# Patient Record
Sex: Female | Born: 1945 | Race: White | Hispanic: No | Marital: Married | Smoking: Never smoker
Health system: Southern US, Community
[De-identification: ages and names within clinical notes are randomized; demographics above are authoritative.]

## PROBLEM LIST (undated history)

## (undated) DIAGNOSIS — Z8619 Personal history of other infectious and parasitic diseases: Secondary | ICD-10-CM

## (undated) HISTORY — PX: BREAST BIOPSY: SHX20

## (undated) HISTORY — DX: Personal history of other infectious and parasitic diseases: Z86.19

---

## 1988-03-08 HISTORY — PX: ABDOMINAL HYSTERECTOMY: SHX81

## 2001-09-13 LAB — HM PAP SMEAR: HM PAP: NORMAL

## 2002-10-07 HISTORY — PX: OTHER SURGICAL HISTORY: SHX169

## 2006-02-03 ENCOUNTER — Ambulatory Visit: Payer: Self-pay | Admitting: Gastroenterology

## 2006-02-03 DIAGNOSIS — Z86010 Personal history of colonic polyps: Secondary | ICD-10-CM | POA: Insufficient documentation

## 2006-02-03 DIAGNOSIS — Z860101 Personal history of adenomatous and serrated colon polyps: Secondary | ICD-10-CM | POA: Insufficient documentation

## 2006-03-08 HISTORY — PX: BREAST SURGERY: SHX581

## 2007-09-06 ENCOUNTER — Ambulatory Visit: Payer: Self-pay | Admitting: Family Medicine

## 2007-09-13 LAB — HM DEXA SCAN: HM Dexa Scan: NORMAL

## 2009-09-26 ENCOUNTER — Ambulatory Visit: Payer: Self-pay | Admitting: Gastroenterology

## 2009-09-26 LAB — HM COLONOSCOPY

## 2012-06-21 LAB — LIPID PANEL
Cholesterol: 228 mg/dL — AB (ref 0–200)
HDL: 74 mg/dL — AB (ref 35–70)
LDL CALC: 131 mg/dL
Triglycerides: 113 mg/dL (ref 40–160)

## 2012-06-21 LAB — BASIC METABOLIC PANEL: Glucose: 88 mg/dL

## 2012-06-28 LAB — HM MAMMOGRAPHY

## 2014-11-20 ENCOUNTER — Telehealth: Payer: Self-pay | Admitting: Family Medicine

## 2014-11-20 NOTE — Telephone Encounter (Signed)
Advised pt, okayed per Dr. Caryn Section, pt can get  both the flu and pneumonia vaccinations at the same time. Scheduled her an appointment on 11/23/2014 for her and an appointment for her husband Joneen Boers for a flu shot on 11/23/2014.  Thanks,

## 2014-11-20 NOTE — Telephone Encounter (Signed)
Pt is asking if she should get the pneumonia shot or the flu shot first. Pt stated she wasn't sure if she can get them together and she doesn't want to over load her body by getting them at the same time. Pt request a nurse to return her call. Thanks TNP

## 2014-11-23 ENCOUNTER — Ambulatory Visit (INDEPENDENT_AMBULATORY_CARE_PROVIDER_SITE_OTHER): Payer: Medicare Other

## 2014-11-23 DIAGNOSIS — Z23 Encounter for immunization: Secondary | ICD-10-CM | POA: Diagnosis not present

## 2014-11-23 NOTE — Addendum Note (Signed)
Addended by: Silvio Clayman on: 11/23/2014 11:25 AM   Modules accepted: Orders

## 2014-12-11 DIAGNOSIS — S8000XA Contusion of unspecified knee, initial encounter: Secondary | ICD-10-CM | POA: Insufficient documentation

## 2014-12-11 DIAGNOSIS — K5790 Diverticulosis of intestine, part unspecified, without perforation or abscess without bleeding: Secondary | ICD-10-CM | POA: Insufficient documentation

## 2014-12-12 ENCOUNTER — Encounter: Payer: Self-pay | Admitting: Family Medicine

## 2014-12-12 ENCOUNTER — Ambulatory Visit (INDEPENDENT_AMBULATORY_CARE_PROVIDER_SITE_OTHER): Payer: Medicare Other | Admitting: Family Medicine

## 2014-12-12 VITALS — BP 94/70 | HR 56 | Temp 97.9°F | Resp 16 | Ht 62.0 in | Wt 121.0 lb

## 2014-12-12 DIAGNOSIS — Z1159 Encounter for screening for other viral diseases: Secondary | ICD-10-CM | POA: Diagnosis not present

## 2014-12-12 DIAGNOSIS — R001 Bradycardia, unspecified: Secondary | ICD-10-CM | POA: Diagnosis not present

## 2014-12-12 DIAGNOSIS — Z0000 Encounter for general adult medical examination without abnormal findings: Secondary | ICD-10-CM | POA: Diagnosis not present

## 2014-12-12 DIAGNOSIS — Z1239 Encounter for other screening for malignant neoplasm of breast: Secondary | ICD-10-CM | POA: Diagnosis not present

## 2014-12-12 DIAGNOSIS — Z131 Encounter for screening for diabetes mellitus: Secondary | ICD-10-CM

## 2014-12-12 DIAGNOSIS — E2839 Other primary ovarian failure: Secondary | ICD-10-CM

## 2014-12-12 NOTE — Progress Notes (Signed)
Patient: Kristin Small, Female    DOB: 09/27/1945, 69 y.o.   MRN: [id number] Visit Date: 12/12/2014  Today's Provider: Lelon Huh, MD   Chief Complaint  Patient presents with  . Annual Exam   Subjective:    Annual physical  Kristin Small is a 69 y.o. female. She feels fairly well. She reports exercising  3 days a week. She reports she is sleeping poorly. Trouble staying asleep.   -----------------------------------------------------------   Review of Systems  Constitutional: Negative for fever, chills and fatigue.  HENT: Positive for hearing loss, postnasal drip and sinus pressure. Negative for congestion, ear pain, rhinorrhea, sneezing and sore throat.   Eyes: Negative.  Negative for pain and redness.  Respiratory: Negative for cough, shortness of breath and wheezing.   Cardiovascular: Negative for chest pain and leg swelling.  Gastrointestinal: Negative for nausea, abdominal pain, diarrhea, constipation and blood in stool.  Endocrine: Negative for polydipsia and polyphagia.  Genitourinary: Negative.  Negative for dysuria, hematuria, flank pain, vaginal bleeding, vaginal discharge and pelvic pain.  Musculoskeletal: Negative for back pain, joint swelling, arthralgias and gait problem.  Skin: Negative for rash.  Allergic/Immunologic: Positive for food allergies.  Neurological: Negative.  Negative for dizziness, tremors, seizures, weakness, light-headedness, numbness and headaches.  Hematological: Negative for adenopathy.  Psychiatric/Behavioral: Positive for sleep disturbance. Negative for behavioral problems, confusion and dysphoric mood. The patient is nervous/anxious. The patient is not hyperactive.     Social History   Social History  . Marital Status: Married    Spouse Name: N/A  . Number of Children: 2  . Years of Education: N/A   Occupational History  . Retired     Former Control and instrumentation engineer   Social History Main Topics  . Smoking status: Never Smoker     . Smokeless tobacco: Not on file  . Alcohol Use: 0.0 oz/week    0 Standard drinks or equivalent per week     Comment: occasional alcohol use; glass of wine with meal  . Drug Use: No  . Sexual Activity: Not on file   Other Topics Concern  . Not on file   Social History Narrative    Patient Active Problem List   Diagnosis Date Noted  . Diverticulosis 12/11/2014    Past Surgical History  Procedure Laterality Date  . Breast surgery Left 2008    multiple breast cyst  . Abdominal hysterectomy  1990    for heavy bleeding  . Cesarean section    . Colon polyps  01/2006  . Tubular adenoma of rectum  10/2002    Her family history includes Alzheimer's disease in her other and other; Atrial fibrillation in her mother; Dementia in her mother; Esophageal cancer in her father; Heart attack in her maternal grandfather; Lung disease in her father; Multiple myeloma in her sister; Pancreatitis in her father; Stroke in her mother; Thyroid disease in her mother and sister.    Previous Medications   ASPIRIN 81 MG TABLET    Take 1 tablet by mouth daily.   B COMPLEX VITAMINS CAPSULE    Take 1 capsule by mouth every other day.    CALCIUM-MAGNESIUM-VITAMIN D (CALCIUM 500 PO)    Take 1 tablet by mouth daily.   CHOLECALCIFEROL (VITAMIN D HIGH POTENCY) 1000 UNITS CAPSULE    Take 1 capsule by mouth daily.   FERROUS SULFATE 325 (65 FE) MG TABLET    Take 325 mg by mouth once a week.  Patient Care Team: Birdie Sons, MD as PCP - General (Family Medicine)     Objective:   Filed Vitals:   12/12/14 0953 12/12/14 1055  BP: 94/70   Pulse: 70 56  Temp: 97.9 F (36.6 C)   TempSrc: Oral   Resp: 16   Height: _0  (1.575 m)   Weight: 121 lb (54.885 kg)   SpO2: 98%     Physical Exam  General Appearance:    Alert, cooperative, no distress, appears stated age  Head:    Normocephalic, without obvious abnormality, atraumatic  Eyes:    PERRL, conjunctiva/corneas clear, EOM's intact, fundi     benign, both eyes  Ears:    Normal TM's and external ear canals, both ears  Nose:   Nares normal, septum midline, mucosa normal, no drainage    or sinus tenderness  Throat:   Lips, mucosa, and tongue normal; teeth and gums normal  Neck:   Supple, symmetrical, trachea midline, no adenopathy;    thyroid:  no enlargement/tenderness/nodules; no carotid   bruit or JVD  Back:     Symmetric, no curvature, ROM normal, no CVA tenderness  Lungs:     Clear to auscultation bilaterally, respirations unlabored  Chest Wall:    No tenderness or deformity   Heart:    Bradycardic. Regular rhythm, S1 and S2 normal, no murmur, rub   or gallop  Breast Exam:    Normal appearance, non tender, no masses  Abdomen:     Soft, non-tender, bowel sounds active all four quadrants,    no masses, no organomegaly  Pelvic:    deferred and not indicated; status post hysterectomy, negative ROS  Extremities:   Extremities normal, atraumatic, no cyanosis or edema  Pulses:   2+ and symmetric all extremities  Skin:   Skin color, texture, turgor normal, no rashes or lesions  Lymph nodes:   Cervical, supraclavicular, and axillary nodes normal  Neurologic:   CNII-XII intact, normal strength, sensation and reflexes    throughout   Activities of Daily Living In your present state of health, do you have any difficulty performing the following activities: 12/12/2014  Hearing? Y  Vision? N  Difficulty concentrating or making decisions? Y  Walking or climbing stairs? N  Dressing or bathing? N  Doing errands, shopping? N    Fall Risk Assessment Fall Risk  12/12/2014  Falls in the past year? No     Depression Screen PHQ 2/9 Scores 12/12/2014 12/12/2014  PHQ - 2 Score 2 2  PHQ- 9 Score 4 -    Cognitive Testing - 6-CIT  Correct? Score   What year is it? yes 0 0 or 4  What month is it? yes 0 0 or 3  Memorize:    Pia Mau,  43,  [place],      What time is it? (within 1 hour) yes 0 0 or 3  Count backwards from  20 yes 0 0, 2, or 4  Name the months of the year yes 0 0, 2, or 4  Repeat name & address above yes 0 0, 2, 4, 6, 8, or 10       TOTAL SCORE  0/28   Interpretation:  Normal  Normal (0-7) Abnormal (8-28)     Audit-C Alcohol Use Screening  Question Answer Points  How often do you have alcoholic drink? 2-3 times weekly 3  On days you do drink alcohol, how many drinks do you typically consume? 0 0  How oftey will you drink 6 or more in a total? never 0  Total Score:  3   A score of 3 or more in women, and 4 or more in men indicates increased risk for alcohol abuse, EXCEPT if all of the points are from question 1.   EKG: SR. 1 PAC  Assessment & Plan:     CPE  Reviewed patient's Family Medical History Reviewed and updated list of patient's medical providers Assessment of cognitive impairment was done Assessed patient's functional ability Established a written schedule for health screening [place] Completed and Reviewed  Exercise Activities and Dietary recommendations Goals    None      Immunization History  Administered Date(s) Administered  . Influenza, High Dose Seasonal PF 11/23/2014  . Pneumococcal Conjugate-13 11/23/2014  . Tdap 09/06/2007, 12/24/2011  . Zoster 06/20/2012    Health Maintenance  Topic Date Due  . Hepatitis C Screening  Aug 15, 1945  . MAMMOGRAM  06/29/2014  . COLONOSCOPY  09/27/2014  . INFLUENZA VACCINE  10/07/2015  . PNA vac Low Risk Adult (2 of 2 - PPSV23) 11/23/2015  . TETANUS/TDAP  12/23/2021  . DEXA SCAN  Completed  . ZOSTAVAX  Completed      Discussed health benefits of physical activity, and encouraged her to engage in regular exercise appropriate for her age and condition.    ------------------------------------------------------------------------------------------------------------ 1. Annual physical exam Generally doing well with normal exam aside from mild bradycardia She states she has received letter from  [facility] to scheduled colonoscopy. She anticipates calling soon.  - Lipid panel - Basic metabolic panel  2. Screening for breast cancer  - MM DIGITAL SCREENING BILATERAL  3. Estrogen deficiency  - DG Bone Density; Future  4. Bradycardia  - EKG 12-Lead  5. Need for hepatitis C screening test  - Hepatitis C antibody

## 2014-12-13 ENCOUNTER — Encounter: Payer: Self-pay | Admitting: Family Medicine

## 2014-12-13 ENCOUNTER — Telehealth: Payer: Self-pay | Admitting: *Deleted

## 2014-12-13 DIAGNOSIS — Z90710 Acquired absence of both cervix and uterus: Secondary | ICD-10-CM | POA: Insufficient documentation

## 2014-12-13 LAB — BASIC METABOLIC PANEL WITH GFR
BUN/Creatinine Ratio: 9 — ABNORMAL LOW (ref 11–26)
BUN: 8 mg/dL (ref 8–27)
CO2: 26 mmol/L (ref 18–29)
Calcium: 9.9 mg/dL (ref 8.7–10.3)
Chloride: 100 mmol/L (ref 97–108)
Creatinine, Ser: 0.89 mg/dL (ref 0.57–1.00)
GFR calc Af Amer: 77 mL/min/1.73
GFR calc non Af Amer: 67 mL/min/1.73
Glucose: 86 mg/dL (ref 65–99)
Potassium: 4.1 mmol/L (ref 3.5–5.2)
Sodium: 143 mmol/L (ref 134–144)

## 2014-12-13 LAB — LIPID PANEL
Chol/HDL Ratio: 2.7 ratio units (ref 0.0–4.4)
Cholesterol, Total: 268 mg/dL — ABNORMAL HIGH (ref 100–199)
HDL: 98 mg/dL (ref >39)
LDL Calculated: 155 mg/dL — ABNORMAL HIGH (ref 0–99)
Triglycerides: 76 mg/dL (ref 0–149)
VLDL Cholesterol Cal: 15 mg/dL (ref 5–40)

## 2014-12-13 LAB — HEPATITIS C ANTIBODY: Hep C Virus Ab: <0.1 {s_co_ratio} (ref 0.0–0.9)

## 2014-12-13 NOTE — Telephone Encounter (Signed)
Pt is requesting a call back to discuss lab results.  CB#[phone]

## 2014-12-13 NOTE — Telephone Encounter (Signed)
Returned pt's call. Patient wanted her cholesterol numbers.

## 2014-12-27 ENCOUNTER — Telehealth: Payer: Self-pay | Admitting: *Deleted

## 2014-12-27 NOTE — Telephone Encounter (Signed)
-----   Message from Birdie Sons, MD sent at 12/27/2014  7:59 AM EDT ----- Regarding: BMD Results Please advise patient that bone density test is normal. Repeat in 3 years.

## 2014-12-31 NOTE — Telephone Encounter (Signed)
Pt advised-aa 

## 2015-01-07 ENCOUNTER — Encounter: Payer: Self-pay | Admitting: Family Medicine

## 2015-01-14 ENCOUNTER — Encounter: Payer: Self-pay | Admitting: Family Medicine

## 2015-06-10 ENCOUNTER — Encounter: Payer: Self-pay | Admitting: Family Medicine

## 2015-12-05 ENCOUNTER — Ambulatory Visit (INDEPENDENT_AMBULATORY_CARE_PROVIDER_SITE_OTHER): Payer: Medicare Other

## 2015-12-05 DIAGNOSIS — Z23 Encounter for immunization: Secondary | ICD-10-CM | POA: Diagnosis not present

## 2015-12-05 NOTE — Progress Notes (Signed)
Vaccines today only.

## 2016-01-22 ENCOUNTER — Ambulatory Visit (INDEPENDENT_AMBULATORY_CARE_PROVIDER_SITE_OTHER): Payer: Medicare Other | Admitting: Family Medicine

## 2016-01-22 ENCOUNTER — Encounter: Payer: Self-pay | Admitting: Family Medicine

## 2016-01-22 VITALS — BP 120/84 | HR 84 | Temp 97.6°F | Resp 16 | Wt 116.0 lb

## 2016-01-22 DIAGNOSIS — J069 Acute upper respiratory infection, unspecified: Secondary | ICD-10-CM

## 2016-01-22 DIAGNOSIS — B9789 Other viral agents as the cause of diseases classified elsewhere: Secondary | ICD-10-CM

## 2016-01-22 NOTE — Progress Notes (Signed)
Subjective:     Patient ID: Kristin Small, female   DOB: March 19, 1945, 70 y.o.   MRN: [id number]  HPI  Chief Complaint  Patient presents with  . URI    x 4 days. Mostly dry cough, sometimes is productive of clear sputum. Chest tightness, PND, rhinorrhea, H/A. Denies fever, facial pain/pressure. Pt has tried Mucinex. Pt is cocnerned because she is visiting her pregnant daughter and 2 grandchildren in [place], [place] for Thanksgiving.  Her husband was ill first and seen here a few days ago for URI.   Review of Systems     Objective:   Physical Exam  Constitutional: She appears well-developed and well-nourished. No distress.  Ears: T.M's intact without inflammation Throat: no tonsillar enlargement or exudate Neck: no cervical adenopathy Lungs: clear     Assessment:    1. Viral upper respiratory tract infection    Plan:    Discussed use of Mucinex D for congestion, Delsym for cough, and Benadryl for postnasal drainage   To call 11/20 if sinuses not improving.

## 2016-01-22 NOTE — Patient Instructions (Signed)
Discussed use of Mucinex D for congestion, Delsym for cough, and Benadryl for postnasal drainage. Let me know on Monday, 11/20, if sinuses not improving.

## 2016-04-16 ENCOUNTER — Telehealth: Payer: Self-pay | Admitting: Family Medicine

## 2016-04-16 NOTE — Telephone Encounter (Signed)
Called Pt to schedule AWV with [facility] - knb °

## 2016-05-14 ENCOUNTER — Encounter: Payer: Self-pay | Admitting: Family Medicine

## 2016-05-14 ENCOUNTER — Ambulatory Visit (INDEPENDENT_AMBULATORY_CARE_PROVIDER_SITE_OTHER): Payer: Medicare Other | Admitting: Family Medicine

## 2016-05-14 VITALS — BP 122/84 | HR 78 | Temp 97.6°F | Resp 16 | Wt 114.6 lb

## 2016-05-14 DIAGNOSIS — J301 Allergic rhinitis due to pollen: Secondary | ICD-10-CM | POA: Diagnosis not present

## 2016-05-14 DIAGNOSIS — H109 Unspecified conjunctivitis: Secondary | ICD-10-CM

## 2016-05-14 MED ORDER — FLUTICASONE PROPIONATE 50 MCG/ACT NA SUSP: 2.0000 | Freq: Every day | NASAL | 6 refills | Status: AC

## 2016-05-14 MED ORDER — SULFACETAMIDE SODIUM 10 % OP SOLN: 2.0000 [drp] | Freq: Four times a day (QID) | OPHTHALMIC | 0 refills | Status: DC

## 2016-05-14 NOTE — Progress Notes (Signed)
Subjective:     Patient ID: Kristin Small, female   DOB: 12/29/1945, 71 y.o.   MRN: [id number]  HPI  Chief Complaint  Patient presents with  . Sinus Problem    Patient comes into office today with concerns of sinus pain and pressure for the past 1.5-2 weeks. Patient states that she has had red eyes, discharge from eyes in AM and watery eyes. Patient reports she believes it was allergies because she has been out of town in [place]. Patient also reports sinus pain around forehead and post nasal drip. Patient states that she has taken otc Mucinex D and Tylenol.   States she developed sore throat on or about 2/25 while in [place] helping to take care of her grandchildren. Subsequently has developed watery eyes and sinus congestion without cough. Left eye has been red but not itchy.   Review of Systems     Objective:   Physical Exam  Constitutional: She appears well-developed and well-nourished. No distress.  Ears: R T.M intact without inflammation; L TM obscured by cerumen Eyes: Right eye unremarkable, left eye with mild conjunctival erythema but sclera is clear; no drainage noted. Sinuses: non-tender Throat: no tonsillar enlargement or exudate Neck: no cervical adenopathy Lungs: clear     Assessment:    1. Acute seasonal allergic rhinitis due to polle - fluticasone (FLONASE) 50 MCG/ACT nasal spray; Place 2 sprays into both nostrils daily.  Dispense: 16 g; Refill: 6  2. Conjunctivitis of left eye, unspecified conjunctivitis type - sulfacetamide (BLEPH-10) 10 % ophthalmic solution; Place 2 drops into both eyes 4 (four) times daily.  Dispense: 15 mL; Refill: 0    Plan:    Discussed use of frequent warm eye compresses. If matting tomorrow to start eye abx. Add Claritin or similar. Discussed technique with steroid nasal spray. She is to call for purulent sinus drainage.

## 2016-05-14 NOTE — Patient Instructions (Signed)
Discussed of warm eye compresses and starting allergy medication-Claritin or similar and steroid nasal spray. If your eye matting does not improve start the eye antibiotic.

## 2016-08-26 ENCOUNTER — Ambulatory Visit (INDEPENDENT_AMBULATORY_CARE_PROVIDER_SITE_OTHER): Payer: Medicare Other

## 2016-08-26 VITALS — BP 130/76 | HR 60 | Temp 98.3°F | Ht 62.0 in | Wt 112.2 lb

## 2016-08-26 DIAGNOSIS — Z0000 Encounter for general adult medical examination without abnormal findings: Secondary | ICD-10-CM

## 2016-08-26 NOTE — Patient Instructions (Addendum)
Kristin Small , Thank you for taking time to come for your Medicare Wellness Visit. I appreciate your ongoing commitment to your health goals. Please review the following plan we discussed and let me know if I can assist you in the future.   Screening recommendations/referrals: Colonoscopy: declined referral, would like to speak to doctor about this further Mammogram: completed 12/27/14, due now Bone Density: completed 12/17/14 Recommended yearly ophthalmology/optometry visit for glaucoma screening and checkup Recommended yearly dental visit for hygiene and checkup  Vaccinations: Influenza vaccine: due 11/2016 Pneumococcal vaccine: completed series Tdap vaccine: completed 12/24/11 Shingles vaccine: completed 06/20/12  Advanced directives: Advance directive discussed with you today. I have provided a copy for you to complete at home and have notarized. Once this is complete please bring a copy in to our office so we can scan it into your chart.  Conditions/risks identified: Recommend decreasing wine intake to 0-1 glasses a day.   Next appointment: None, need to schedule follow up with PCP and 1 year AWV.   Preventive Care 44 Years and Older, Female Preventive care refers to lifestyle choices and visits with your health care provider that can promote health and wellness. What does preventive care include?  A yearly physical exam. This is also called an annual well check.  Dental exams once or twice a year.  Routine eye exams. Ask your health care provider how often you should have your eyes checked.  Personal lifestyle choices, including:  Daily care of your teeth and gums.  Regular physical activity.  Eating a healthy diet.  Avoiding tobacco and drug use.  Limiting alcohol use.  Practicing safe sex.  Taking low-dose aspirin every day.  Taking vitamin and mineral supplements as recommended by your health care provider. What happens during an annual well check? The  services and screenings done by your health care provider during your annual well check will depend on your age, overall health, lifestyle risk factors, and family history of disease. Counseling  Your health care provider may ask you questions about your:  Alcohol use.  Tobacco use.  Drug use.  Emotional well-being.  Home and relationship well-being.  Sexual activity.  Eating habits.  History of falls.  Memory and ability to understand (cognition).  Work and work Statistician.  Reproductive health. Screening  You may have the following tests or measurements:  Height, weight, and BMI.  Blood pressure.  Lipid and cholesterol levels. These may be checked every 5 years, or more frequently if you are over 45 years old.  Skin check.  Lung cancer screening. You may have this screening every year starting at age 89 if you have a 30-pack-year history of smoking and currently smoke or have quit within the past 15 years.  Fecal occult blood test (FOBT) of the stool. You may have this test every year starting at age 10.  Flexible sigmoidoscopy or colonoscopy. You may have a sigmoidoscopy every 5 years or a colonoscopy every 10 years starting at age 70.  Hepatitis C blood test.  Hepatitis B blood test.  Sexually transmitted disease (STD) testing.  Diabetes screening. This is done by checking your blood sugar (glucose) after you have not eaten for a while (fasting). You may have this done every 1-3 years.  Bone density scan. This is done to screen for osteoporosis. You may have this done starting at age 35.  Mammogram. This may be done every 1-2 years. Talk to your health care provider about how often you should have regular mammograms.  Talk with your health care provider about your test results, treatment options, and if necessary, the need for more tests. Vaccines  Your health care provider may recommend certain vaccines, such as:  Influenza vaccine. This is recommended  every year.  Tetanus, diphtheria, and acellular pertussis (Tdap, Td) vaccine. You may need a Td booster every 10 years.  Zoster vaccine. You may need this after age 30.  Pneumococcal 13-valent conjugate (PCV13) vaccine. One dose is recommended after age 15.  Pneumococcal polysaccharide (PPSV23) vaccine. One dose is recommended after age 55. Talk to your health care provider about which screenings and vaccines you need and how often you need them. This information is not intended to replace advice given to you by your health care provider. Make sure you discuss any questions you have with your health care provider. Document Released: 03/21/2015 Document Revised: 11/12/2015 Document Reviewed: 12/24/2014 Elsevier Interactive Patient Education  2017 [place] Prevention in the Home Falls can cause injuries. They can happen to people of all ages. There are many things you can do to make your home safe and to help prevent falls. What can I do on the outside of my home?  Regularly fix the edges of walkways and driveways and fix any cracks.  Remove anything that might make you trip as you walk through a door, such as a raised step or threshold.  Trim any bushes or trees on the path to your home.  Use bright outdoor lighting.  Clear any walking paths of anything that might make someone trip, such as rocks or tools.  Regularly check to see if handrails are loose or broken. Make sure that both sides of any steps have handrails.  Any raised decks and porches should have guardrails on the edges.  Have any leaves, snow, or ice cleared regularly.  Use sand or salt on walking paths during winter.  Clean up any spills in your garage right away. This includes oil or grease spills. What can I do in the bathroom?  Use night lights.  Install grab bars by the toilet and in the tub and shower. Do not use towel bars as grab bars.  Use non-skid mats or decals in the tub or shower.  If  you need to sit down in the shower, use a plastic, non-slip stool.  Keep the floor dry. Clean up any water that spills on the floor as soon as it happens.  Remove soap buildup in the tub or shower regularly.  Attach bath mats securely with double-sided non-slip rug tape.  Do not have throw rugs and other things on the floor that can make you trip. What can I do in the bedroom?  Use night lights.  Make sure that you have a light by your bed that is easy to reach.  Do not use any sheets or blankets that are too big for your bed. They should not hang down onto the floor.  Have a firm chair that has side arms. You can use this for support while you get dressed.  Do not have throw rugs and other things on the floor that can make you trip. What can I do in the kitchen?  Clean up any spills right away.  Avoid walking on wet floors.  Keep items that you use a lot in easy-to-reach places.  If you need to reach something above you, use a strong step stool that has a grab bar.  Keep electrical cords out of the way.  Do not use floor polish or wax that makes floors slippery. If you must use wax, use non-skid floor wax.  Do not have throw rugs and other things on the floor that can make you trip. What can I do with my stairs?  Do not leave any items on the stairs.  Make sure that there are handrails on both sides of the stairs and use them. Fix handrails that are broken or loose. Make sure that handrails are as long as the stairways.  Check any carpeting to make sure that it is firmly attached to the stairs. Fix any carpet that is loose or worn.  Avoid having throw rugs at the top or bottom of the stairs. If you do have throw rugs, attach them to the floor with carpet tape.  Make sure that you have a light switch at the top of the stairs and the bottom of the stairs. If you do not have them, ask someone to add them for you. What else can I do to help prevent falls?  Wear shoes  that:  Do not have high heels.  Have rubber bottoms.  Are comfortable and fit you well.  Are closed at the toe. Do not wear sandals.  If you use a stepladder:  Make sure that it is fully opened. Do not climb a closed stepladder.  Make sure that both sides of the stepladder are locked into place.  Ask someone to hold it for you, if possible.  Clearly mark and make sure that you can see:  Any grab bars or handrails.  First and last steps.  Where the edge of each step is.  Use tools that help you move around (mobility aids) if they are needed. These include:  Canes.  Walkers.  Scooters.  Crutches.  Turn on the lights when you go into a dark area. Replace any light bulbs as soon as they burn out.  Set up your furniture so you have a clear path. Avoid moving your furniture around.  If any of your floors are uneven, fix them.  If there are any pets around you, be aware of where they are.  Review your medicines with your doctor. Some medicines can make you feel dizzy. This can increase your chance of falling. Ask your doctor what other things that you can do to help prevent falls. This information is not intended to replace advice given to you by your health care provider. Make sure you discuss any questions you have with your health care provider. Document Released: 12/19/2008 Document Revised: 07/31/2015 Document Reviewed: 03/29/2014 Elsevier Interactive Patient Education  2017 [employer].

## 2016-08-26 NOTE — Progress Notes (Signed)
Subjective:   Kristin Small is a 71 y.o. female who presents for Medicare Annual (Subsequent) preventive examination.  Review of Systems:  N/A  Cardiac Risk Factors include: advanced age (>44mn, >>72women);sedentary lifestyle     Objective:     Vitals: BP 130/76 (BP Location: Right Arm)   Pulse 60   Temp 98.3 F (36.8 C) (Oral)   Ht 5' 2"  (1.575 m)   Wt 112 lb 3.2 oz (50.9 kg)   BMI 20.52 kg/m   Body mass index is 20.52 kg/m.   Tobacco History  Smoking Status  . Never Smoker  Smokeless Tobacco  . Never Used     Counseling given: Not Answered   Past Medical History:  Diagnosis Date  . Diverticulosis   . History of chicken pox    Past Surgical History:  Procedure Laterality Date  . ABDOMINAL HYSTERECTOMY  1990   for heavy bleeding  . BREAST SURGERY Left 2008   multiple breast cyst  . CESAREAN SECTION    . Tubular Adenoma of rectum  10/2002   Family History  Problem Relation Age of Onset  . Thyroid disease Mother   . Stroke Mother   . Dementia Mother   . Atrial fibrillation Mother   . Aortic aneurysm Mother   . Esophageal cancer Father   . Pancreatitis Father   . Lung disease Father        restrictive lung disease  . Thyroid disease Sister   . Multiple myeloma Sister   . Heart attack Maternal Grandfather   . Alzheimer's disease Other   . Alzheimer's disease Other    History  Sexual Activity  . Sexual activity: Not on file    Outpatient Encounter Prescriptions as of 08/26/2016  Medication Sig  . aspirin 81 MG tablet Take 1 tablet by mouth daily.  . Calcium-Magnesium-Vitamin D (CALCIUM 500 PO) Take 1 tablet by mouth daily.  . Cholecalciferol (VITAMIN D HIGH POTENCY) 1000 UNITS capsule Take 2,000 Units by mouth every other day.   . ferrous sulfate 325 (65 FE) MG tablet Take 325 mg by mouth every 3 (three) days.   . fluticasone (FLONASE) 50 MCG/ACT nasal spray Place 2 sprays into both nostrils daily. (Patient taking differently: Place 2 sprays  into both nostrils daily. )  . Ginkgo Biloba (GINKOBA) 40 MG TABS Take 120 mg by mouth daily.  . SUPER B COMPLEX/C CAPS Take 1 capsule by mouth daily.  . TURMERIC PO Take 500 mg by mouth daily.   . [DISCONTINUED] sulfacetamide (BLEPH-10) 10 % ophthalmic solution Place 2 drops into both eyes 4 (four) times daily.   No facility-administered encounter medications on file as of 08/26/2016.     Activities of Daily Living In your present state of health, do you have any difficulty performing the following activities: 08/26/2016  Hearing? Y  Vision? Y  Difficulty concentrating or making decisions? Y  Walking or climbing stairs? N  Dressing or bathing? N  Doing errands, shopping? N  Preparing Food and eating ? N  Using the Toilet? N  In the past six months, have you accidently leaked urine? N  Do you have problems with loss of bowel control? N  Managing your Medications? N  Managing your Finances? N  Housekeeping or managing your Housekeeping? N  Some recent data might be hidden    Patient Care Team: FBirdie Sons MD as PCP - General (Family Medicine) Covert, DRonelle Nigh, MD as Consulting Physician (Ophthalmology)  Assessment:     Exercise Activities and Dietary recommendations Current Exercise Habits: Home exercise routine, Type of exercise: walking, Time (Minutes): 45, Frequency (Times/Week): 5, Weekly Exercise (Minutes/Week): 225, Intensity: Mild, Exercise limited by: None identified  Goals    . Reduce alcohol intake to 0-1 servings per day          Recommend decreasing wine intake to 0-1 glasses a day.       Fall Risk Fall Risk  08/26/2016 12/12/2014  Falls in the past year? No No   Depression Screen PHQ 2/9 Scores 08/26/2016 08/26/2016 12/12/2014 12/12/2014  PHQ - 2 Score 1 1 2 2   PHQ- 9 Score 3 - 4 -     Cognitive Function     6CIT Screen 08/26/2016  What Year? 0 points  What month? 0 points  What time? 0 points  Count back from 20 0 points  Months in reverse 0  points  Repeat phrase 0 points  Total Score 0    Immunization History  Administered Date(s) Administered  . Influenza, High Dose Seasonal PF 11/23/2014, 12/05/2015  . Pneumococcal Conjugate-13 11/23/2014  . Pneumococcal Polysaccharide-23 12/05/2015  . Tdap 09/06/2007, 12/24/2011  . Zoster 06/20/2012   Screening Tests Health Maintenance  Topic Date Due  . COLONOSCOPY  09/27/2014  . INFLUENZA VACCINE  10/06/2016  . MAMMOGRAM  12/26/2016  . TETANUS/TDAP  12/23/2021  . DEXA SCAN  Completed  . Hepatitis C Screening  Completed  . PNA vac Low Risk Adult  Completed      Plan:  I have personally reviewed and addressed the Medicare Annual Wellness questionnaire and have noted the following in the patient's chart:  A. Medical and social history B. Use of alcohol, tobacco or illicit drugs  C. Current medications and supplements D. Functional ability and status E.  Nutritional status F.  Physical activity G. Advance directives H. List of other physicians I.  Hospitalizations, surgeries, and ER visits in previous 12 months J.  [place] such as hearing and vision if needed, cognitive and depression L. Referrals and appointments - none  In addition, I have reviewed and discussed with patient certain preventive protocols, quality metrics, and best practice recommendations. A written personalized care plan for preventive services as well as general preventive health recommendations were provided to patient.  See attached scanned questionnaire for additional information.   Signed,  Fabio Neighbors, LPN Nurse Health Advisor   MD Recommendations: Pt declined colonoscopy referral today. Pt would like to speak to doctor further about this.

## 2016-09-27 ENCOUNTER — Encounter: Payer: Medicare Other | Admitting: Family Medicine

## 2016-10-06 ENCOUNTER — Ambulatory Visit (INDEPENDENT_AMBULATORY_CARE_PROVIDER_SITE_OTHER): Payer: Medicare Other | Admitting: Family Medicine

## 2016-10-06 ENCOUNTER — Encounter: Payer: Self-pay | Admitting: Family Medicine

## 2016-10-06 ENCOUNTER — Telehealth: Payer: Self-pay | Admitting: Family Medicine

## 2016-10-06 VITALS — BP 120/84 | HR 74 | Temp 97.8°F | Resp 16 | Ht 62.0 in | Wt 115.0 lb

## 2016-10-06 DIAGNOSIS — H9110 Presbycusis, unspecified ear: Secondary | ICD-10-CM | POA: Insufficient documentation

## 2016-10-06 DIAGNOSIS — Z0000 Encounter for general adult medical examination without abnormal findings: Secondary | ICD-10-CM | POA: Diagnosis not present

## 2016-10-06 DIAGNOSIS — I8393 Asymptomatic varicose veins of bilateral lower extremities: Secondary | ICD-10-CM

## 2016-10-06 DIAGNOSIS — E785 Hyperlipidemia, unspecified: Secondary | ICD-10-CM

## 2016-10-06 DIAGNOSIS — Z86010 Personal history of colonic polyps: Secondary | ICD-10-CM

## 2016-10-06 DIAGNOSIS — I8390 Asymptomatic varicose veins of unspecified lower extremity: Secondary | ICD-10-CM

## 2016-10-06 DIAGNOSIS — H9113 Presbycusis, bilateral: Secondary | ICD-10-CM | POA: Diagnosis not present

## 2016-10-06 NOTE — Telephone Encounter (Signed)
Pt called saying Dr. Caryn Section recommended her to go see [facility].  She called for an appt and they told to have Dr. Caryn Section refer her there if she needed an appt. She has [employer].  Pt's call back is  [phone]  Thank sTeri

## 2016-10-06 NOTE — Progress Notes (Signed)
Patient: Kristin Small, Female    DOB: 1945-11-12, 71 y.o.   MRN: [id number] Visit Date: 10/06/2016  Today's Provider: Lelon Huh, MD   Chief Complaint  Patient presents with  . Annual Exam   Subjective:      Annual physical exam Kristin Small is a 71 y.o. female who presents today for health maintenance and complete physical. She feels well. She reports exercising yes/walking. She reports she is sleeping fairly well/does wake some during the night.  ----------------------------------------------------------------   Review of Systems  Constitutional: Negative for chills, diaphoresis and fever.  HENT: Positive for hearing loss and postnasal drip. Negative for congestion, ear discharge, ear pain, nosebleeds, sore throat and tinnitus.   Eyes: Negative for photophobia, pain, discharge and redness.  Respiratory: Negative for cough, shortness of breath, wheezing and stridor.   Cardiovascular: Negative for chest pain, palpitations and leg swelling.  Gastrointestinal: Negative for abdominal pain, blood in stool, constipation, diarrhea, nausea and vomiting.  Endocrine: Negative for polydipsia.  Genitourinary: Negative for dysuria, flank pain, frequency, hematuria and urgency.  Musculoskeletal: Negative for back pain, myalgias and neck pain.  Skin: Negative for rash.  Allergic/Immunologic: Negative for environmental allergies.  Neurological: Negative for dizziness, tremors, seizures, weakness and headaches.  Hematological: Does not bruise/bleed easily.  Psychiatric/Behavioral: Positive for sleep disturbance. Negative for hallucinations and suicidal ideas. The patient is not nervous/anxious.     Social History      She  reports that she has never smoked. She has never used smokeless tobacco. She reports that she drinks about 4.2 - 8.4 oz of alcohol per week . She reports that she does not use drugs.       Social History   Social History  . Marital status: Married    Spouse  name: N/A  . Number of children: 2  . Years of education: N/A   Occupational History  . Retired     Former Control and instrumentation engineer   Social History Main Topics  . Smoking status: Never Smoker  . Smokeless tobacco: Never Used  . Alcohol use 4.2 - 8.4 oz/week    7 - 14 Glasses of wine per week  . Drug use: No  . Sexual activity: Not Asked   Other Topics Concern  . None   Social History Narrative  . None    Past Medical History:  Diagnosis Date  . Diverticulosis   . History of chicken pox      Patient Active Problem List   Diagnosis Date Noted  . Need for pneumococcal vaccination 12/05/2015  . Flu vaccine need 12/05/2015  . S/P hysterectomy 12/13/2014  . Diverticulosis 12/11/2014    Past Surgical History:  Procedure Laterality Date  . ABDOMINAL HYSTERECTOMY  1990   for heavy bleeding  . BREAST SURGERY Left 2008   multiple breast cyst  . CESAREAN SECTION    . Tubular Adenoma of rectum  10/2002    Family History        Family Status  Relation Status  . Mother Deceased       Pacemaker  . Father Deceased  . Sister Deceased  . MGF Deceased at age 71       MI  . Other (Not Specified)  . Other (Not Specified)        Her family history includes Alzheimer's disease in her other and other; Aortic aneurysm in her mother; Atrial fibrillation in her mother; Dementia in her mother; Esophageal cancer in her  father; Heart attack in her maternal grandfather; Lung disease in her father; Multiple myeloma in her sister; Pancreatitis in her father; Stroke in her mother; Thyroid disease in her mother and sister.     Allergies  Allergen Reactions  . Codeine Nausea Only    Other reaction(s): Vomiting  . Penicillins Rash     Current Outpatient Prescriptions:  .  aspirin 81 MG tablet, Take 1 tablet by mouth daily., Disp: , Rfl:  .  Calcium-Magnesium-Vitamin D (CALCIUM 500 PO), Take 1 tablet by mouth daily., Disp: , Rfl:  .  Cholecalciferol (VITAMIN D HIGH POTENCY) 1000 UNITS  capsule, Take 2,000 Units by mouth every other day. , Disp: , Rfl:  .  ferrous sulfate 325 (65 FE) MG tablet, Take 325 mg by mouth every 3 (three) days. , Disp: , Rfl:  .  fluticasone (FLONASE) 50 MCG/ACT nasal spray, Place 2 sprays into both nostrils daily. (Patient taking differently: Place 2 sprays into both nostrils daily. ), Disp: 16 g, Rfl: 6 .  Ginkgo Biloba (GINKOBA) 40 MG TABS, Take 120 mg by mouth daily., Disp: , Rfl:  .  SUPER B COMPLEX/C CAPS, Take 1 capsule by mouth daily., Disp: , Rfl:  .  TURMERIC PO, Take 500 mg by mouth daily. , Disp: , Rfl:    Patient Care Team: Birdie Sons, MD as PCP - General (Family Medicine) Covert, Ronelle Nigh., MD as Consulting Physician (Ophthalmology)      Objective:   Vitals: BP 120/84 (BP Location: Right Arm, Patient Position: Sitting, Cuff Size: Normal)   Pulse 74   Temp 97.8 F (36.6 C) (Oral)   Resp 16   Ht 5' 2"  (1.575 m)   Wt 115 lb (52.2 kg)   SpO2 98%   BMI 21.03 kg/m       Physical Exam   General Appearance:    Alert, cooperative, no distress, appears stated age  Head:    Normocephalic, without obvious abnormality, atraumatic  Eyes:    PERRL, conjunctiva/corneas clear, EOM's intact, fundi    benign, both eyes  Ears:    Normal TM's and external ear canals, both ears  Nose:   Nares normal, septum midline, mucosa normal, no drainage    or sinus tenderness  Throat:   Lips, mucosa, and tongue normal; teeth and gums normal  Neck:   Supple, symmetrical, trachea midline, no adenopathy;    thyroid:  no enlargement/tenderness/nodules; no carotid   bruit or JVD  Back:     Symmetric, no curvature, ROM normal, no CVA tenderness  Lungs:     Clear to auscultation bilaterally, respirations unlabored  Chest Wall:    No tenderness or deformity   Heart:    Regular rate and rhythm, S1 and S2 normal, no murmur, rub   or gallop  Breast Exam:    normal appearance, no masses or tenderness  Abdomen:     Soft, non-tender, bowel sounds active  all four quadrants,    no masses, no organomegaly  Pelvic:    deferred  Extremities:   Extremities normal, atraumatic, no cyanosis or edema  Pulses:   2+ and symmetric all extremities. Tender varicosities noted both LEs. No erythema.   Skin:   Skin color, texture, turgor normal, no rashes or lesions  Lymph nodes:   Cervical, supraclavicular, and axillary nodes normal  Neurologic:   CNII-XII intact, normal strength, sensation and reflexes    throughout  ] Depression Screen PHQ 2/9 Scores 08/26/2016 08/26/2016 12/12/2014 12/12/2014  PHQ -  2 Score 1 1 2 2   PHQ- 9 Score 3 - 4 -      Assessment & Plan:     Routine Health Maintenance and Physical Exam  Exercise Activities and Dietary recommendations Goals    . Reduce alcohol intake to 0-1 servings per day          Recommend decreasing wine intake to 0-1 glasses a day.        Immunization History  Administered Date(s) Administered  . Influenza, High Dose Seasonal PF 11/23/2014, 12/05/2015  . Pneumococcal Conjugate-13 11/23/2014  . Pneumococcal Polysaccharide-23 12/05/2015  . Tdap 09/06/2007, 12/24/2011  . Zoster 06/20/2012    Health Maintenance  Topic Date Due  . COLONOSCOPY  09/27/2014  . INFLUENZA VACCINE  10/06/2016  . MAMMOGRAM  12/26/2016  . TETANUS/TDAP  12/23/2021  . DEXA SCAN  Completed  . Hepatitis C Screening  Completed  . PNA vac Low Risk Adult  Completed     Discussed health benefits of physical activity, and encouraged her to engage in regular exercise appropriate for her age and condition.    --------------------------------------------------------------------  1. Annual physical exam Generally doing well.   2. History of adenomatous polyp of colon She is due for follow up colonoscopy. She has contact information from [facility] and will call if she needs referral .  3. Hyperlipidemia, unspecified hyperlipidemia type Diet controlled.  - Lipid panel - Comprehensive metabolic panel  4. Varicose veins of  both lower extremities She would like to see vascular specialist. She states she will call back if she needs referral.   5. Presbycusis of both ears She states she is anticipating seeing ENT for hearing aids and will call for referral if needed.     Lelon Huh, MD  [facility]

## 2016-10-06 NOTE — Patient Instructions (Addendum)
The CDC recommends two doses of Shingrix separated by 2 to 6 months for adults age 70 years and older. I recommend checking with your insurance plan regarding coverage for this vaccine.    You are due for a colonoscopy, you can call [facility] GI department to have this scheduled     Preventive Care 61 Years and Older, Female Preventive care refers to lifestyle choices and visits with your health care provider that can promote health and wellness. What does preventive care include?  A yearly physical exam. This is also called an annual well check.  Dental exams once or twice a year.  Routine eye exams. Ask your health care provider how often you should have your eyes checked.  Personal lifestyle choices, including: ? Daily care of your teeth and gums. ? Regular physical activity. ? Eating a healthy diet. ? Avoiding tobacco and drug use. ? Limiting alcohol use. ? Practicing safe sex. ? Taking low-dose aspirin every day. ? Taking vitamin and mineral supplements as recommended by your health care provider. What happens during an annual well check? The services and screenings done by your health care provider during your annual well check will depend on your age, overall health, lifestyle risk factors, and family history of disease. Counseling Your health care provider may ask you questions about your:  Alcohol use.  Tobacco use.  Drug use.  Emotional well-being.  Home and relationship well-being.  Sexual activity.  Eating habits.  History of falls.  Memory and ability to understand (cognition).  Work and work Statistician.  Reproductive health.  Screening You may have the following tests or measurements:  Height, weight, and BMI.  Blood pressure.  Lipid and cholesterol levels. These may be checked every 5 years, or more frequently if you are over 67 years old.  Skin check.  Lung cancer screening. You may have this screening every year starting at  age 54 if you have a 30-pack-year history of smoking and currently smoke or have quit within the past 15 years.  Fecal occult blood test (FOBT) of the stool. You may have this test every year starting at age 45.  Flexible sigmoidoscopy or colonoscopy. You may have a sigmoidoscopy every 5 years or a colonoscopy every 10 years starting at age 15.  Hepatitis C blood test.  Hepatitis B blood test.  Sexually transmitted disease (STD) testing.  Diabetes screening. This is done by checking your blood sugar (glucose) after you have not eaten for a while (fasting). You may have this done every 1-3 years.  Bone density scan. This is done to screen for osteoporosis. You may have this done starting at age 19.  Mammogram. This may be done every 1-2 years. Talk to your health care provider about how often you should have regular mammograms.  Talk with your health care provider about your test results, treatment options, and if necessary, the need for more tests. Vaccines Your health care provider may recommend certain vaccines, such as:  Influenza vaccine. This is recommended every year.  Tetanus, diphtheria, and acellular pertussis (Tdap, Td) vaccine. You may need a Td booster every 10 years.  Varicella vaccine. You may need this if you have not been vaccinated.  Zoster vaccine. You may need this after age 39.  Measles, mumps, and rubella (MMR) vaccine. You may need at least one dose of MMR if you were born in 1957 or later. You may also need a second dose.  Pneumococcal 13-valent conjugate (PCV13) vaccine. One dose  is recommended after age 44.  Pneumococcal polysaccharide (PPSV23) vaccine. One dose is recommended after age 59.  Meningococcal vaccine. You may need this if you have certain conditions.  Hepatitis A vaccine. You may need this if you have certain conditions or if you travel or work in places where you may be exposed to hepatitis A.  Hepatitis B vaccine. You may need this if  you have certain conditions or if you travel or work in places where you may be exposed to hepatitis B.  Haemophilus influenzae type b (Hib) vaccine. You may need this if you have certain conditions.  Talk to your health care provider about which screenings and vaccines you need and how often you need them. This information is not intended to replace advice given to you by your health care provider. Make sure you discuss any questions you have with your health care provider. Document Released: 03/21/2015 Document Revised: 11/12/2015 Document Reviewed: 12/24/2014 Elsevier Interactive Patient Education  2017 [employer].

## 2016-10-08 ENCOUNTER — Telehealth: Payer: Self-pay

## 2016-10-08 LAB — COMPREHENSIVE METABOLIC PANEL
A/G RATIO: 2 (ref 1.2–2.2)
ALBUMIN: 4.2 g/dL (ref 3.5–4.8)
ALK PHOS: 51 IU/L (ref 39–117)
ALT: 17 IU/L (ref 0–32)
AST: 17 IU/L (ref 0–40)
BUN / CREAT RATIO: 12 (ref 12–28)
BUN: 9 mg/dL (ref 8–27)
Bilirubin Total: 0.5 mg/dL (ref 0.0–1.2)
CO2: 26 mmol/L (ref 20–29)
Calcium: 9.2 mg/dL (ref 8.7–10.3)
Chloride: 102 mmol/L (ref 96–106)
Creatinine, Ser: 0.73 mg/dL (ref 0.57–1.00)
GFR calc Af Amer: 96 mL/min/{1.73_m2} (ref >59)
GFR calc non Af Amer: 84 mL/min/{1.73_m2} (ref >59)
GLOBULIN, TOTAL: 2.1 g/dL (ref 1.5–4.5)
Glucose: 80 mg/dL (ref 65–99)
POTASSIUM: 3.9 mmol/L (ref 3.5–5.2)
SODIUM: 141 mmol/L (ref 134–144)
Total Protein: 6.3 g/dL (ref 6.0–8.5)

## 2016-10-08 LAB — LIPID PANEL
CHOLESTEROL TOTAL: 223 mg/dL — AB (ref 100–199)
Chol/HDL Ratio: 2.3 ratio (ref 0.0–4.4)
HDL: 96 mg/dL (ref >39)
LDL Calculated: 113 mg/dL — ABNORMAL HIGH (ref 0–99)
TRIGLYCERIDES: 71 mg/dL (ref 0–149)
VLDL CHOLESTEROL CAL: 14 mg/dL (ref 5–40)

## 2016-10-08 NOTE — Telephone Encounter (Signed)
Referral has been ordered

## 2016-10-08 NOTE — Telephone Encounter (Signed)
-----   Message from Birdie Sons, MD sent at 10/08/2016  8:06 AM EDT ----- Cholesterol better, down from 268 to 223. Normal kidney functions, liver functions and electrolyte. Check yearly.

## 2016-10-08 NOTE — Telephone Encounter (Signed)
Pt advised as below. She verbally acknowledges understanding, and is in agreement with treatment plan.

## 2016-10-13 ENCOUNTER — Telehealth: Payer: Self-pay

## 2016-10-13 DIAGNOSIS — H9193 Unspecified hearing loss, bilateral: Secondary | ICD-10-CM

## 2016-10-13 DIAGNOSIS — Z135 Encounter for screening for eye and ear disorders: Secondary | ICD-10-CM

## 2016-10-13 LAB — HM MAMMOGRAPHY

## 2016-10-13 NOTE — Telephone Encounter (Signed)
Patient called and is requesting to get referral to [facility] for routine eye check up. Also referral to ENT due to decreased hearing, no ringing in the years. Please review.-aa

## 2016-10-14 NOTE — Telephone Encounter (Signed)
Referral orders entered. 

## 2016-10-15 ENCOUNTER — Encounter: Payer: Self-pay | Admitting: *Deleted

## 2016-10-19 ENCOUNTER — Encounter: Payer: Self-pay | Admitting: Family Medicine

## 2016-11-11 ENCOUNTER — Ambulatory Visit: Payer: Medicare Other

## 2016-11-18 ENCOUNTER — Ambulatory Visit (INDEPENDENT_AMBULATORY_CARE_PROVIDER_SITE_OTHER): Payer: Medicare Other

## 2016-11-18 DIAGNOSIS — Z23 Encounter for immunization: Secondary | ICD-10-CM | POA: Diagnosis not present

## 2016-11-19 ENCOUNTER — Ambulatory Visit (INDEPENDENT_AMBULATORY_CARE_PROVIDER_SITE_OTHER): Payer: Medicare Other | Admitting: Vascular Surgery

## 2016-11-19 ENCOUNTER — Encounter (INDEPENDENT_AMBULATORY_CARE_PROVIDER_SITE_OTHER): Payer: Self-pay | Admitting: Vascular Surgery

## 2016-11-19 VITALS — BP 134/80 | HR 65 | Resp 17 | Ht 62.5 in | Wt 115.0 lb

## 2016-11-19 DIAGNOSIS — E785 Hyperlipidemia, unspecified: Secondary | ICD-10-CM | POA: Diagnosis not present

## 2016-11-19 DIAGNOSIS — I8393 Asymptomatic varicose veins of bilateral lower extremities: Secondary | ICD-10-CM | POA: Diagnosis not present

## 2016-11-19 NOTE — Patient Instructions (Signed)
Varicose Veins Varicose veins are veins that have become enlarged and twisted. They are usually seen in the legs but can occur in other parts of the body as well. What are the causes? This condition is the result of valves in the veins not working properly. Valves in the veins help to return blood from the leg to the heart. If these valves are damaged, blood flows backward and backs up into the veins in the leg near the skin. This causes the veins to become larger. What increases the risk? People who are on their feet a lot, who are pregnant, or who are overweight are more likely to develop varicose veins. What are the signs or symptoms?  Bulging, twisted-appearing, bluish veins, most commonly found on the legs.  Leg pain or a feeling of heaviness. These symptoms may be worse at the end of the day.  Leg swelling.  Changes in skin color. How is this diagnosed? A health care provider can usually diagnose varicose veins by examining your legs. Your health care provider may also recommend an ultrasound of your leg veins. How is this treated? Most varicose veins can be treated at home.However, other treatments are available for people who have persistent symptoms or want to improve the cosmetic appearance of the varicose veins. These treatment options include:  Sclerotherapy. A solution is injected into the vein to close it off.  Laser treatment. A laser is used to heat the vein to close it off.  Radiofrequency vein ablation. An electrical current produced by radio waves is used to close off the vein.  Phlebectomy. The vein is surgically removed through small incisions made over the varicose vein.  Vein ligation and stripping. The vein is surgically removed through incisions made over the varicose vein after the vein has been tied (ligated). Follow these instructions at home:   Do not stand or sit in one position for long periods of time. Do not sit with your legs crossed. Rest with your  legs raised during the day.  Wear compression stockings as directed by your health care provider. These stockings help to prevent blood clots and reduce swelling in your legs.  Do not wear other tight, encircling garments around your legs, pelvis, or waist.  Walk as much as possible to increase blood flow.  Raise the foot of your bed at night with 2-inch blocks.  If you get a cut in the skin over the vein and the vein bleeds, lie down with your leg raised and press on it with a clean cloth until the bleeding stops. Then place a bandage (dressing) on the cut. See your health care provider if it continues to bleed. Contact a health care provider if:  The skin around your ankle starts to break down.  You have pain, redness, tenderness, or hard swelling in your leg over a vein.  You are uncomfortable because of leg pain. This information is not intended to replace advice given to you by your health care provider. Make sure you discuss any questions you have with your health care provider. Document Released: 12/02/2004 Document Revised: 07/31/2015 Document Reviewed: 08/26/2015 Elsevier Interactive Patient Education  2017 [employer]  

## 2016-11-19 NOTE — Progress Notes (Signed)
Patient ID: Kristin Small, female   DOB: 17-Feb-1946, 71 y.o.   MRN: [id number]  Chief Complaint  Patient presents with  . Varicose Veins    HPI Kristin Small is a 71 y.o. female.  I am asked to see the patient by Dr. Caryn Section for evaluation of leg pain and varicose veins.  The patient presents with complaints of symptomatic varicosities of the legs. The patient reports a long standing history of varicosities and they have become painful over time. There was no clear inciting event or causative factor that started the symptoms.  The right leg is more severly affected. She has previously had sclerotherapy with pretty good results about ten years ago. The patient elevates the legs for relief. The pain is described as Cramping and heaviness. The symptoms are generally most severe in the evening, particularly when they have been on their feet for long periods of time. Elevation and exercise has been used to try to improve the symptoms with some success. The patient does not have much swelling. The patient has no previous history of deep venous thrombosis or superficial thrombophlebitis to their knowledge.     Past Medical History:  Diagnosis Date  . Diverticulosis   . History of chicken pox     Past Surgical History:  Procedure Laterality Date  . ABDOMINAL HYSTERECTOMY  1990   for heavy bleeding  . BREAST SURGERY Left 2008   multiple breast cyst  . CESAREAN SECTION    . Tubular Adenoma of rectum  10/2002    Family History  Problem Relation Age of Onset  . Thyroid disease Mother   . Stroke Mother   . Dementia Mother   . Atrial fibrillation Mother   . Aortic aneurysm Mother   . Esophageal cancer Father   . Pancreatitis Father   . Lung disease Father        restrictive lung disease  . Thyroid disease Sister   . Multiple myeloma Sister   . Heart attack Maternal Grandfather   . Alzheimer's disease Other   . Alzheimer's disease Other      Social History Social History    Substance Use Topics  . Smoking status: Never Smoker  . Smokeless tobacco: Never Used  . Alcohol use 4.2 - 8.4 oz/week    7 - 14 Glasses of wine per week     Allergies  Allergen Reactions  . Codeine Nausea Only    Other reaction(s): Vomiting  . Penicillins Rash    Current Outpatient Prescriptions  Medication Sig Dispense Refill  . aspirin 81 MG tablet Take 1 tablet by mouth daily.    . Calcium-Magnesium-Vitamin D (CALCIUM 500 PO) Take 1 tablet by mouth daily.    . Cholecalciferol (VITAMIN D HIGH POTENCY) 1000 UNITS capsule Take 2,000 Units by mouth every other day.     . ferrous sulfate 325 (65 FE) MG tablet Take 325 mg by mouth every 3 (three) days.     . fluticasone (FLONASE) 50 MCG/ACT nasal spray Place 2 sprays into both nostrils daily. (Patient taking differently: Place 2 sprays into both nostrils daily. ) 16 g 6  . Ginkgo Biloba (GINKOBA) 40 MG TABS Take 120 mg by mouth daily.    . Multiple Vitamins-Minerals (MULTIVITAMIN ADULT) TABS Take by mouth daily.    . TURMERIC PO Take 500 mg by mouth daily.     . SUPER B COMPLEX/C CAPS Take 1 capsule by mouth daily.     No current facility-administered  medications for this visit.       REVIEW OF SYSTEMS (Negative unless checked)  Constitutional: [] Weight loss  [] Fever  [] Chills Cardiac: [] Chest pain   [] Chest pressure   [] Palpitations   [] Shortness of breath when laying flat   [] Shortness of breath at rest   [] Shortness of breath with exertion. Vascular:  [] Pain in legs with walking   [] Pain in legs at rest   [] Pain in legs when laying flat   [] Claudication   [] Pain in feet when walking  [] Pain in feet at rest  [] Pain in feet when laying flat   [] History of DVT   [] Phlebitis   [] Swelling in legs   [x] Varicose veins   [] Non-healing ulcers Pulmonary:   [] Uses home oxygen   [] Productive cough   [] Hemoptysis   [] Wheeze  [] COPD   [] Asthma Neurologic:  [] Dizziness  [] Blackouts   [] Seizures   [] History of stroke   [] History of TIA   [] Aphasia   [] Temporary blindness   [] Dysphagia   [] Weakness or numbness in arms   [] Weakness or numbness in legs Musculoskeletal:  [x] Arthritis   [] Joint swelling   [] Joint pain   [] Low back pain Hematologic:  [] Easy bruising  [] Easy bleeding   [] Hypercoagulable state   [] Anemic  [] Hepatitis Gastrointestinal:  [] Blood in stool   [] Vomiting blood  [] Gastroesophageal reflux/heartburn   [] Abdominal pain Genitourinary:  [] Chronic kidney disease   [] Difficult urination  [] Frequent urination  [] Burning with urination   [] Hematuria Skin:  [] Rashes   [] Ulcers   [] Wounds Psychological:  [] History of anxiety   []  History of major depression.    Physical Exam BP 134/80   Pulse 65   Resp 17   Ht 5' 2.5" (1.588 m)   Wt 52.2 kg (115 lb)   BMI 20.70 kg/m  Gen:  WD/WN, NAD. Appears younger than stated age Head: [place]/AT, No temporalis wasting.  Ear/Nose/Throat: Hearing grossly intact, dentition good Eyes: Sclera non-icteric. Conjunctiva clear Neck: Supple, no nuchal rigidity. Trachea midline Pulmonary:  Good air movement, no use of accessory muscles, respirations not labored.  Cardiac: RRR, No JVD Vascular: Varicosities diffuse and measuring up to 2 mm in the right lower extremity        Varicosities scattered and measuring up to 1-2 mm in the left lower extremity Vessel Right Left  Radial Palpable Palpable                          PT Palpable Palpable  DP Palpable Palpable    Musculoskeletal: M/S 5/5 throughout.   No LE edema Neurologic: Sensation grossly intact in extremities.  Symmetrical.  Speech is fluent.  Psychiatric: Judgment intact, Mood & affect appropriate for pt's clinical situation. Dermatologic: No rashes or ulcers noted.  No cellulitis or open wounds.    Radiology No results found.  Labs Recent Results (from the past 2160 hour(s))  Lipid panel     Status: Abnormal   Collection Time: 10/07/16  8:37 AM  Result Value Ref Range   Cholesterol, Total 223 (H) 100 - 199  mg/dL   Triglycerides 71 0 - 149 mg/dL   HDL 96 >39 mg/dL   VLDL Cholesterol Cal 14 5 - 40 mg/dL   LDL Calculated 113 (H) 0 - 99 mg/dL   Chol/HDL Ratio 2.3 0.0 - 4.4 ratio    Comment:  T. Chol/HDL Ratio                                             Men  Women                               1/2 Avg.Risk  3.4    3.3                                   Avg.Risk  5.0    4.4                                2X Avg.Risk  9.6    7.1                                3X Avg.Risk 23.4   11.0   Comprehensive metabolic panel     Status: None   Collection Time: 10/07/16  8:37 AM  Result Value Ref Range   Glucose 80 65 - 99 mg/dL   BUN 9 8 - 27 mg/dL   Creatinine, Ser 0.73 0.57 - 1.00 mg/dL   GFR calc non Af Amer 84 >59 mL/min/1.73   GFR calc Af Amer 96 >59 mL/min/1.73   BUN/Creatinine Ratio 12 12 - 28   Sodium 141 134 - 144 mmol/L   Potassium 3.9 3.5 - 5.2 mmol/L   Chloride 102 96 - 106 mmol/L   CO2 26 20 - 29 mmol/L   Calcium 9.2 8.7 - 10.3 mg/dL   Total Protein 6.3 6.0 - 8.5 g/dL   Albumin 4.2 3.5 - 4.8 g/dL   Globulin, Total 2.1 1.5 - 4.5 g/dL   Albumin/Globulin Ratio 2.0 1.2 - 2.2   Bilirubin Total 0.5 0.0 - 1.2 mg/dL   Alkaline Phosphatase 51 39 - 117 IU/L   AST 17 0 - 40 IU/L   ALT 17 0 - 32 IU/L  HM MAMMOGRAPHY     Status: None   Collection Time: 10/13/16 12:00 AM  Result Value Ref Range   HM Mammogram 0-4 Bi-Rad 0-4 Bi-Rad, Self Reported Normal    Comment: BI-RADS Category 2-Benign  F/U 1 year    Assessment/Plan:  Hyperlipidemia lipid control important in reducing the progression of atherosclerotic disease.    Varicose veins of both lower extremities   The patient has symptoms consistent with chronic venous insufficiency. We discussed the natural history and treatment options for venous disease. I recommended the regular use of 20 - 30 mm Hg compression stockings, and prescribed these today. I recommended leg elevation and anti-inflammatories as  needed for pain. I have also recommended a complete venous duplex to assess the venous system for reflux or thrombotic issues. This can be done at the patient's convenience. I will see the patient back in 3 months to assess the response to conservative management, and determine further treatment options.     Leotis Pain 11/19/2016, 10:07 AM   This note was created with Dragon medical transcription system.  Any errors from dictation are unintentional.

## 2016-11-19 NOTE — Assessment & Plan Note (Signed)
lipid control important in reducing the progression of atherosclerotic disease.   

## 2017-02-18 ENCOUNTER — Telehealth: Payer: Self-pay | Admitting: Family Medicine

## 2017-02-22 ENCOUNTER — Encounter (INDEPENDENT_AMBULATORY_CARE_PROVIDER_SITE_OTHER): Payer: Medicare Other

## 2017-02-22 ENCOUNTER — Ambulatory Visit (INDEPENDENT_AMBULATORY_CARE_PROVIDER_SITE_OTHER): Payer: Medicare Other | Admitting: Vascular Surgery

## 2017-02-24 NOTE — Telephone Encounter (Signed)
eror/MW

## 2017-03-29 ENCOUNTER — Encounter (INDEPENDENT_AMBULATORY_CARE_PROVIDER_SITE_OTHER): Payer: Medicare Other

## 2017-03-29 ENCOUNTER — Ambulatory Visit (INDEPENDENT_AMBULATORY_CARE_PROVIDER_SITE_OTHER): Payer: Medicare Other | Admitting: Vascular Surgery

## 2017-04-05 DIAGNOSIS — R69 Illness, unspecified: Secondary | ICD-10-CM | POA: Diagnosis not present

## 2017-09-26 NOTE — Progress Notes (Signed)
Patient: Kristin Small, Female    DOB: April 15, 1945, 72 y.o.   MRN: [id number] Visit Date: 09/26/2017  Today's Provider: Lelon Huh, MD   No chief complaint on file.  Subjective:   Patient saw McKenzie for AWV today at 8:30 am.    Complete Physical Kristin Small is a 72 y.o. female. She feels well. She reports exercising yes/walking 5 days a week. She reports she is sleeping well. ----------------------------------------------------------   Lipid/Cholesterol, Follow-up:   Last seen for this 1 years ago.  Management since that visit includes; labs checked, no changes. Diet controlled.  Last Lipid Panel:    Component Value Date/Time   CHOL 223 (H) 10/07/2016 0837   TRIG 71 10/07/2016 0837   HDL 96 10/07/2016 0837   CHOLHDL 2.3 10/07/2016 0837   LDLCALC 113 (H) 10/07/2016 0837    She reports good compliance with treatment. She is not having side effects. none  Wt Readings from Last 3 Encounters:  11/19/16 115 lb (52.2 kg)  10/06/16 115 lb (52.2 kg)  08/26/16 112 lb 3.2 oz (50.9 kg)    ----------------------------------------------------------    Review of Systems  Constitutional: Negative for chills, diaphoresis and fever.  HENT: Positive for hearing loss. Negative for congestion, ear discharge, ear pain, nosebleeds, sore throat and tinnitus.   Eyes: Negative for photophobia, pain, discharge and redness.  Respiratory: Negative for cough, shortness of breath, wheezing and stridor.   Cardiovascular: Negative for chest pain, palpitations and leg swelling.  Gastrointestinal: Negative for abdominal pain, blood in stool, constipation, diarrhea, nausea and vomiting.  Endocrine: Negative for polydipsia.  Genitourinary: Negative for dysuria, flank pain, frequency, hematuria and urgency.  Musculoskeletal: Negative for back pain, myalgias and neck pain.  Skin: Negative for rash.  Allergic/Immunologic: Negative for environmental allergies.  Neurological: Negative  for dizziness, tremors, seizures, weakness and headaches.  Hematological: Does not bruise/bleed easily.  Psychiatric/Behavioral: Negative for hallucinations and suicidal ideas. The patient is not nervous/anxious.     Social History   Socioeconomic History  . Marital status: Married    Spouse name: Not on file  . Number of children: 2  . Years of education: Not on file  . Highest education level: Not on file  Occupational History  . Occupation: Retired    Comment: Former Control and instrumentation engineer  Social Needs  . Financial resource strain: Not on file  . Food insecurity:    Worry: Not on file    Inability: Not on file  . Transportation needs:    Medical: Not on file    Non-medical: Not on file  Tobacco Use  . Smoking status: Never Smoker  . Smokeless tobacco: Never Used  Substance and Sexual Activity  . Alcohol use: Yes    Alcohol/week: 4.2 - 8.4 oz    Types: 7 - 14 Glasses of wine per week  . Drug use: No  . Sexual activity: Not on file  Lifestyle  . Physical activity:    Days per week: Not on file    Minutes per session: Not on file  . Stress: Not on file  Relationships  . Social connections:    Talks on phone: Not on file    Gets together: Not on file    Attends religious service: Not on file    Active member of club or organization: Not on file    Attends meetings of clubs or organizations: Not on file    Relationship status: Not on file  . Intimate  partner violence:    Fear of current or ex partner: Not on file    Emotionally abused: Not on file    Physically abused: Not on file    Forced sexual activity: Not on file  Other Topics Concern  . Not on file  Social History Narrative  . Not on file    Past Medical History:  Diagnosis Date  . Diverticulosis   . History of chicken pox      Patient Active Problem List   Diagnosis Date Noted  . Hyperlipidemia 10/06/2016  . Varicose veins of both lower extremities 10/06/2016  . Presbycusis 10/06/2016  . S/P  hysterectomy 12/13/2014  . Diverticulosis 12/11/2014  . History of adenomatous polyp of colon 02/03/2006    Past Surgical History:  Procedure Laterality Date  . ABDOMINAL HYSTERECTOMY  1990   for heavy bleeding  . BREAST SURGERY Left 2008   multiple breast cyst  . CESAREAN SECTION    . Tubular Adenoma of rectum  10/2002    Her family history includes Alzheimer's disease in her other and other; Aortic aneurysm in her mother; Atrial fibrillation in her mother; Dementia in her mother; Esophageal cancer in her father; Heart attack in her maternal grandfather; Lung disease in her father; Multiple myeloma in her sister; Pancreatitis in her father; Stroke in her mother; Thyroid disease in her mother and sister.      Current Outpatient Medications:  .  aspirin 81 MG tablet, Take 1 tablet by mouth daily., Disp: , Rfl:  .  Calcium-Magnesium-Vitamin D (CALCIUM 500 PO), Take 1 tablet by mouth daily., Disp: , Rfl:  .  Cholecalciferol (VITAMIN D HIGH POTENCY) 1000 UNITS capsule, Take 2,000 Units by mouth every other day. , Disp: , Rfl:  .  ferrous sulfate 325 (65 FE) MG tablet, Take 325 mg by mouth every 3 (three) days. , Disp: , Rfl:  .  fluticasone (FLONASE) 50 MCG/ACT nasal spray, Place 2 sprays into both nostrils daily. (Patient taking differently: Place 2 sprays into both nostrils daily. ), Disp: 16 g, Rfl: 6 .  Ginkgo Biloba (GINKOBA) 40 MG TABS, Take 120 mg by mouth daily., Disp: , Rfl:  .  Multiple Vitamins-Minerals (MULTIVITAMIN ADULT) TABS, Take by mouth daily., Disp: , Rfl:  .  SUPER B COMPLEX/C CAPS, Take 1 capsule by mouth daily., Disp: , Rfl:  .  TURMERIC PO, Take 500 mg by mouth daily. , Disp: , Rfl:   Patient Care Team: Birdie Sons, MD as PCP - General (Family Medicine) Covert, Ronelle Nigh., MD as Consulting Physician (Ophthalmology)     Objective:   Vitals:   BP 132/62 (BP Location: Right Arm)   Pulse 61   Temp 97.8 F (36.6 C) (Oral)   Ht 5' 3"  (1.6 m)   Wt 111 lb  (50.3 kg)   BMI 19.66 kg/m   BSA 1.5 m   Physical Exam   General Appearance:    Alert, cooperative, no distress, appears stated age  Head:    Normocephalic, without obvious abnormality, atraumatic  Eyes:    PERRL, conjunctiva/corneas clear, EOM's intact, fundi    benign, both eyes  Ears:    Normal TM's and external ear canals, both ears  Nose:   Nares normal, septum midline, mucosa normal, no drainage    or sinus tenderness  Throat:   Lips, mucosa, and tongue normal; teeth and gums normal  Neck:   Supple, symmetrical, trachea midline, no adenopathy;    thyroid:  no enlargement/tenderness/nodules;  no carotid   bruit or JVD  Back:     Symmetric, no curvature, ROM normal, no CVA tenderness  Lungs:     Clear to auscultation bilaterally, respirations unlabored  Chest Wall:    No tenderness or deformity   Heart:    Regular rate and rhythm, S1 and S2 normal, no murmur, rub   or gallop  Breast Exam:    normal appearance, no masses or tenderness  Abdomen:     Soft, non-tender, bowel sounds active all four quadrants,    no masses, no organomegaly  Pelvic:    deferred  Extremities:   Extremities normal, atraumatic, no cyanosis or edema  Pulses:   2+ and symmetric all extremities  Skin:   Skin color, texture, turgor normal, no rashes or lesions  Lymph nodes:   Cervical, supraclavicular, and axillary nodes normal  Neurologic:   CNII-XII intact, normal strength, sensation and reflexes    throughout    Activities of Daily Living No flowsheet data found.  Fall Risk Assessment Fall Risk  08/26/2016 12/12/2014  Falls in the past year? No No     Depression Screen PHQ 2/9 Scores 08/26/2016 08/26/2016 12/12/2014 12/12/2014  PHQ - 2 Score 1 1 2 2   PHQ- 9 Score 3 - 4 -      Assessment & Plan:    Annual Physical Reviewed patient's Family Medical History Reviewed and updated list of patient's medical providers Assessment of cognitive impairment was done Assessed patient's functional  ability Established a written schedule for health screening [place] Completed and Reviewed  Exercise Activities and Dietary recommendations Goals    None      Immunization History  Administered Date(s) Administered  . Influenza, High Dose Seasonal PF 11/23/2014, 12/05/2015, 11/18/2016  . Pneumococcal Conjugate-13 11/23/2014  . Pneumococcal Polysaccharide-23 12/05/2015  . Tdap 09/06/2007, 12/24/2011  . Zoster 06/20/2012    Health Maintenance  Topic Date Due  . COLONOSCOPY  09/27/2014  . INFLUENZA VACCINE  10/06/2017  . MAMMOGRAM  10/13/2017  . TETANUS/TDAP  12/23/2021  . DEXA SCAN  Completed  . Hepatitis C Screening  Completed  . PNA vac Low Risk Adult  Completed     Discussed health benefits of physical activity, and encouraged her to engage in regular exercise appropriate for her age and condition.    ------------------------------------------------------------------------------------------------------------  1. Annual physical exam Counseled risk benefit aspirin therapy and mutually agreed that risk outweigh benefits, so she will d/c 74m ECASA.   2. Hyperlipidemia, unspecified hyperlipidemia type Diet controlled.  - Comprehensive metabolic panel - Lipid panel - EKG 12-Lead  3. History of adenomatous polyp of colon Last done by Dr. SDonnella Shamin 2011 and is overdue to colonscopy.  - Ambulatory referral to Gastroenterology  4. Estrogen deficiency  - DG Bone Density; Future   DLelon Huh MD  [facility]

## 2017-09-27 ENCOUNTER — Ambulatory Visit (INDEPENDENT_AMBULATORY_CARE_PROVIDER_SITE_OTHER): Payer: Medicare HMO

## 2017-09-27 ENCOUNTER — Ambulatory Visit (INDEPENDENT_AMBULATORY_CARE_PROVIDER_SITE_OTHER): Payer: Medicare HMO | Admitting: Family Medicine

## 2017-09-27 VITALS — BP 132/62 | HR 61 | Temp 97.8°F | Ht 63.0 in | Wt 111.0 lb

## 2017-09-27 DIAGNOSIS — Z0000 Encounter for general adult medical examination without abnormal findings: Secondary | ICD-10-CM

## 2017-09-27 DIAGNOSIS — E2839 Other primary ovarian failure: Secondary | ICD-10-CM | POA: Diagnosis not present

## 2017-09-27 DIAGNOSIS — Z86010 Personal history of colonic polyps: Secondary | ICD-10-CM

## 2017-09-27 DIAGNOSIS — Z1211 Encounter for screening for malignant neoplasm of colon: Secondary | ICD-10-CM

## 2017-09-27 DIAGNOSIS — E785 Hyperlipidemia, unspecified: Secondary | ICD-10-CM | POA: Diagnosis not present

## 2017-09-27 NOTE — Patient Instructions (Addendum)
Kristin Small , Thank you for taking time to come for your Medicare Wellness Visit. I appreciate your ongoing commitment to your health goals. Please review the following plan we discussed and let me know if I can assist you in the future.   Screening recommendations/referrals: Colonoscopy: Referral sent today.  Mammogram: Up to date Bone Density: Up to date Recommended yearly ophthalmology/optometry visit for glaucoma screening and checkup Recommended yearly dental visit for hygiene and checkup  Vaccinations: Influenza vaccine: Up to date Pneumococcal vaccine: Up to date Tdap vaccine: Up to date Shingles vaccine: Pt declines today.     Advanced directives: Please bring a copy of your POA (Power of Attorney) and/or Living Will to your next appointment.   Conditions/risks identified: Recommend to monitor daily sugar intake and substitute for fruits.   Next appointment: 9:00 AM today with Dr Caryn Section.    Preventive Care 71 Years and Older, Female Preventive care refers to lifestyle choices and visits with your health care provider that can promote health and wellness. What does preventive care include?  A yearly physical exam. This is also called an annual well check.  Dental exams once or twice a year.  Routine eye exams. Ask your health care provider how often you should have your eyes checked.  Personal lifestyle choices, including:  Daily care of your teeth and gums.  Regular physical activity.  Eating a healthy diet.  Avoiding tobacco and drug use.  Limiting alcohol use.  Practicing safe sex.  Taking low-dose aspirin every day.  Taking vitamin and mineral supplements as recommended by your health care provider. What happens during an annual well check? The services and screenings done by your health care provider during your annual well check will depend on your age, overall health, lifestyle risk factors, and family history of disease. Counseling  Your health care  provider may ask you questions about your:  Alcohol use.  Tobacco use.  Drug use.  Emotional well-being.  Home and relationship well-being.  Sexual activity.  Eating habits.  History of falls.  Memory and ability to understand (cognition).  Work and work Statistician.  Reproductive health. Screening  You may have the following tests or measurements:  Height, weight, and BMI.  Blood pressure.  Lipid and cholesterol levels. These may be checked every 5 years, or more frequently if you are over 71 years old.  Skin check.  Lung cancer screening. You may have this screening every year starting at age 71 if you have a 30-pack-year history of smoking and currently smoke or have quit within the past 15 years.  Fecal occult blood test (FOBT) of the stool. You may have this test every year starting at age 71.  Flexible sigmoidoscopy or colonoscopy. You may have a sigmoidoscopy every 5 years or a colonoscopy every 10 years starting at age 71.  Hepatitis C blood test.  Hepatitis B blood test.  Sexually transmitted disease (STD) testing.  Diabetes screening. This is done by checking your blood sugar (glucose) after you have not eaten for a while (fasting). You may have this done every 1-3 years.  Bone density scan. This is done to screen for osteoporosis. You may have this done starting at age 71.  Mammogram. This may be done every 1-2 years. Talk to your health care provider about how often you should have regular mammograms. Talk with your health care provider about your test results, treatment options, and if necessary, the need for more tests. Vaccines  Your health care provider  may recommend certain vaccines, such as:  Influenza vaccine. This is recommended every year.  Tetanus, diphtheria, and acellular pertussis (Tdap, Td) vaccine. You may need a Td booster every 10 years.  Zoster vaccine. You may need this after age 71.  Pneumococcal 13-valent conjugate (PCV13)  vaccine. One dose is recommended after age 71.  Pneumococcal polysaccharide (PPSV23) vaccine. One dose is recommended after age 21. Talk to your health care provider about which screenings and vaccines you need and how often you need them. This information is not intended to replace advice given to you by your health care provider. Make sure you discuss any questions you have with your health care provider. Document Released: 03/21/2015 Document Revised: 11/12/2015 Document Reviewed: 12/24/2014 Elsevier Interactive Patient Education  2017 [place] Prevention in the Home Falls can cause injuries. They can happen to people of all ages. There are many things you can do to make your home safe and to help prevent falls. What can I do on the outside of my home?  Regularly fix the edges of walkways and driveways and fix any cracks.  Remove anything that might make you trip as you walk through a door, such as a raised step or threshold.  Trim any bushes or trees on the path to your home.  Use bright outdoor lighting.  Clear any walking paths of anything that might make someone trip, such as rocks or tools.  Regularly check to see if handrails are loose or broken. Make sure that both sides of any steps have handrails.  Any raised decks and porches should have guardrails on the edges.  Have any leaves, snow, or ice cleared regularly.  Use sand or salt on walking paths during winter.  Clean up any spills in your garage right away. This includes oil or grease spills. What can I do in the bathroom?  Use night lights.  Install grab bars by the toilet and in the tub and shower. Do not use towel bars as grab bars.  Use non-skid mats or decals in the tub or shower.  If you need to sit down in the shower, use a plastic, non-slip stool.  Keep the floor dry. Clean up any water that spills on the floor as soon as it happens.  Remove soap buildup in the tub or shower  regularly.  Attach bath mats securely with double-sided non-slip rug tape.  Do not have throw rugs and other things on the floor that can make you trip. What can I do in the bedroom?  Use night lights.  Make sure that you have a light by your bed that is easy to reach.  Do not use any sheets or blankets that are too big for your bed. They should not hang down onto the floor.  Have a firm chair that has side arms. You can use this for support while you get dressed.  Do not have throw rugs and other things on the floor that can make you trip. What can I do in the kitchen?  Clean up any spills right away.  Avoid walking on wet floors.  Keep items that you use a lot in easy-to-reach places.  If you need to reach something above you, use a strong step stool that has a grab bar.  Keep electrical cords out of the way.  Do not use floor polish or wax that makes floors slippery. If you must use wax, use non-skid floor wax.  Do not have throw rugs  and other things on the floor that can make you trip. What can I do with my stairs?  Do not leave any items on the stairs.  Make sure that there are handrails on both sides of the stairs and use them. Fix handrails that are broken or loose. Make sure that handrails are as long as the stairways.  Check any carpeting to make sure that it is firmly attached to the stairs. Fix any carpet that is loose or worn.  Avoid having throw rugs at the top or bottom of the stairs. If you do have throw rugs, attach them to the floor with carpet tape.  Make sure that you have a light switch at the top of the stairs and the bottom of the stairs. If you do not have them, ask someone to add them for you. What else can I do to help prevent falls?  Wear shoes that:  Do not have high heels.  Have rubber bottoms.  Are comfortable and fit you well.  Are closed at the toe. Do not wear sandals.  If you use a stepladder:  Make sure that it is fully  opened. Do not climb a closed stepladder.  Make sure that both sides of the stepladder are locked into place.  Ask someone to hold it for you, if possible.  Clearly mark and make sure that you can see:  Any grab bars or handrails.  First and last steps.  Where the edge of each step is.  Use tools that help you move around (mobility aids) if they are needed. These include:  Canes.  Walkers.  Scooters.  Crutches.  Turn on the lights when you go into a dark area. Replace any light bulbs as soon as they burn out.  Set up your furniture so you have a clear path. Avoid moving your furniture around.  If any of your floors are uneven, fix them.  If there are any pets around you, be aware of where they are.  Review your medicines with your doctor. Some medicines can make you feel dizzy. This can increase your chance of falling. Ask your doctor what other things that you can do to help prevent falls. This information is not intended to replace advice given to you by your health care provider. Make sure you discuss any questions you have with your health care provider. Document Released: 12/19/2008 Document Revised: 07/31/2015 Document Reviewed: 03/29/2014 Elsevier Interactive Patient Education  2017 [employer].

## 2017-09-27 NOTE — Progress Notes (Signed)
Subjective:   MARIALIZ FERREBEE is a 72 y.o. female who presents for Medicare Annual (Subsequent) preventive examination.  Review of Systems:  N/A  Cardiac Risk Factors include: advanced age (>69mn, >>58women)     Objective:     Vitals: BP 132/62 (BP Location: Right Arm)   Pulse 61   Temp 97.8 F (36.6 C) (Oral)   Ht 5' 3"  (1.6 m)   Wt 111 lb (50.3 kg)   BMI 19.66 kg/m   Body mass index is 19.66 kg/m.  Advanced Directives 09/27/2017 11/19/2016 08/26/2016  Does Patient Have a Medical Advance Directive? Yes No No  Type of AParamedicof APalmyraLiving will - -  Copy of H[place]in Chart? No - copy requested - -  Would patient like information on creating a medical advance directive? - - Yes (ED - Information included in AVS)    Tobacco Social History   Tobacco Use  Smoking Status Never Smoker  Smokeless Tobacco Never Used     Counseling given: Not Answered   Clinical Intake:  Pre-visit preparation completed: Yes  Pain : No/denies pain Pain Score: 0-No pain     Nutritional Status: BMI of 19-24  Normal Nutritional Risks: None Diabetes: No  How often do you need to have someone help you when you read instructions, pamphlets, or other written materials from your doctor or pharmacy?: 1 - Never  Interpreter Needed?: No  Information entered by :: [facility]  Past Medical History:  Diagnosis Date  . Diverticulosis   . History of chicken pox    Past Surgical History:  Procedure Laterality Date  . ABDOMINAL HYSTERECTOMY  1990   for heavy bleeding  . BREAST SURGERY Left 2008   multiple breast cyst  . CESAREAN SECTION    . Tubular Adenoma of rectum  10/2002   Family History  Problem Relation Age of Onset  . Thyroid disease Mother   . Stroke Mother   . Dementia Mother   . Atrial fibrillation Mother   . Aortic aneurysm Mother   . Esophageal cancer Father   . Pancreatitis Father   . Lung disease Father      restrictive lung disease  . Thyroid disease Sister   . Multiple myeloma Sister   . Heart attack Maternal Grandfather   . Alzheimer's disease Other   . Alzheimer's disease Other    Social History   Socioeconomic History  . Marital status: Married    Spouse name: Not on file  . Number of children: 2  . Years of education: Not on file  . Highest education level: Associate degree: occupational, tHotel manager or vocational program  Occupational History  . Occupation: Retired    Comment: Former TControl and instrumentation engineer Social Needs  . Financial resource strain: Not hard at all  . Food insecurity:    Worry: Never true    Inability: Never true  . Transportation needs:    Medical: No    Non-medical: No  Tobacco Use  . Smoking status: Never Smoker  . Smokeless tobacco: Never Used  Substance and Sexual Activity  . Alcohol use: Yes    Alcohol/week: 4.2 - 8.4 oz    Types: 7 - 14 Glasses of wine per week  . Drug use: No  . Sexual activity: Not on file  Lifestyle  . Physical activity:    Days per week: Not on file    Minutes per session: Not on file  . Stress: Not  at all  Relationships  . Social connections:    Talks on phone: Not on file    Gets together: Not on file    Attends religious service: Not on file    Active member of club or organization: Not on file    Attends meetings of clubs or organizations: Not on file    Relationship status: Not on file  Other Topics Concern  . Not on file  Social History Narrative  . Not on file    Outpatient Encounter Medications as of 09/27/2017  Medication Sig  . aspirin 81 MG tablet Take 1 tablet by mouth daily.  . Calcium 500 MG tablet Take 1,000 mg by mouth.  . Calcium-Magnesium-Vitamin D (CALCIUM 500 PO) Take 1 tablet by mouth daily.  . ferrous sulfate 325 (65 FE) MG tablet Take 325 mg by mouth every 3 (three) days.   . fluticasone (FLONASE) 50 MCG/ACT nasal spray Place 2 sprays into both nostrils daily. (Patient taking differently:  Place 2 sprays into both nostrils daily. )  . Ginkgo Biloba (GINKOBA) 40 MG TABS Take 120 mg by mouth daily.  . Multiple Vitamins-Minerals (MULTIVITAMIN ADULT) TABS Take by mouth daily.  . TURMERIC PO Take 500 mg by mouth daily.   . Cholecalciferol (VITAMIN D HIGH POTENCY) 1000 UNITS capsule Take 2,000 Units by mouth every other day.   . SUPER B COMPLEX/C CAPS Take 1 capsule by mouth daily.   No facility-administered encounter medications on file as of 09/27/2017.     Activities of Daily Living In your present state of health, do you have any difficulty performing the following activities: 09/27/2017  Hearing? Y  Comment Wears hearing aids.   Vision? N  Difficulty concentrating or making decisions? N  Walking or climbing stairs? N  Dressing or bathing? N  Doing errands, shopping? N  Preparing Food and eating ? N  Using the Toilet? N  In the past six months, have you accidently leaked urine? N  Do you have problems with loss of bowel control? N  Managing your Medications? N  Managing your Finances? N  Housekeeping or managing your Housekeeping? N  Some recent data might be hidden    Patient Care Team: Birdie Sons, MD as PCP - General (Family Medicine) [place], Morrison as Consulting Physician (Optometry) Lucky Cowboy, Erskine Squibb, MD as Referring Physician (Vascular Surgery)    Assessment:   This is a routine wellness examination for [place].  Exercise Activities and Dietary recommendations Current Exercise Habits: Home exercise routine, Type of exercise: walking, Time (Minutes): 60, Frequency (Times/Week): 4, Weekly Exercise (Minutes/Week): 240, Intensity: Mild, Exercise limited by: None identified  Goals    . DIET - REDUCE SUGAR INTAKE     Recommend to monitor daily sugar intake and substitute for fruits.        Fall Risk Fall Risk  09/27/2017 08/26/2016 12/12/2014  Falls in the past year? No No No   Is the patient's home free of loose throw rugs in walkways, pet beds,  electrical cords, etc?   yes      Grab bars in the bathroom? no      Handrails on the stairs?   yes      Adequate lighting?   yes  Timed Get Up and Go performed: N/A  Depression Screen PHQ 2/9 Scores 09/27/2017 08/26/2016 08/26/2016 12/12/2014  PHQ - 2 Score 0 1 1 2   PHQ- 9 Score 1 3 - 4     Cognitive Function: Pt  declined screening today.      6CIT Screen 08/26/2016  What Year? 0 points  What month? 0 points  What time? 0 points  Count back from 20 0 points  Months in reverse 0 points  Repeat phrase 0 points  Total Score 0    Immunization History  Administered Date(s) Administered  . Influenza, High Dose Seasonal PF 11/23/2014, 12/05/2015, 11/18/2016  . Pneumococcal Conjugate-13 11/23/2014  . Pneumococcal Polysaccharide-23 12/05/2015  . Tdap 09/06/2007, 12/24/2011  . Zoster 06/20/2012    Qualifies for Shingles Vaccine? Due for Shingles vaccine. Declined my offer to administer today. Education has been provided regarding the importance of this vaccine. Pt has been advised to call her insurance company to determine her out of pocket expense. Advised she may also receive this vaccine at her local pharmacy or Health Dept. Verbalized acceptance and understanding.  Screening Tests Health Maintenance  Topic Date Due  . COLONOSCOPY  09/27/2014  . INFLUENZA VACCINE  10/06/2017  . MAMMOGRAM  10/13/2017  . TETANUS/TDAP  12/23/2021  . DEXA SCAN  Completed  . Hepatitis C Screening  Completed  . PNA vac Low Risk Adult  Completed    Cancer Screenings: Lung: Low Dose CT Chest recommended if Age 84-80 years, 30 pack-year currently smoking OR have quit w/in 15years. Patient does not qualify. Breast:  Up to date on Mammogram? Yes   Up to date of Bone Density/Dexa? Yes Colorectal: Not up to date, referral sent today.  Additional Screenings:  Hepatitis C Screening: Up to date     Plan:  I have personally reviewed and addressed the Medicare Annual Wellness questionnaire and have  noted the following in the patient's chart:  A. Medical and social history B. Use of alcohol, tobacco or illicit drugs  C. Current medications and supplements D. Functional ability and status E.  Nutritional status F.  Physical activity G. Advance directives H. List of other physicians I.  Hospitalizations, surgeries, and ER visits in previous 12 months J.  [place] such as hearing and vision if needed, cognitive and depression L. Referrals and appointments - none  In addition, I have reviewed and discussed with patient certain preventive protocols, quality metrics, and best practice recommendations. A written personalized care plan for preventive services as well as general preventive health recommendations were provided to patient.  See attached scanned questionnaire for additional information.   Signed,  Fabio Neighbors, LPN Nurse Health Advisor   Nurse Recommendations: None. Colonoscopy referral sent today.

## 2017-09-28 ENCOUNTER — Telehealth: Payer: Self-pay

## 2017-09-28 ENCOUNTER — Other Ambulatory Visit: Payer: Self-pay

## 2017-09-28 DIAGNOSIS — Z1211 Encounter for screening for malignant neoplasm of colon: Secondary | ICD-10-CM

## 2017-09-28 LAB — COMPREHENSIVE METABOLIC PANEL
A/G RATIO: 2.7 — AB (ref 1.2–2.2)
ALT: 17 IU/L (ref 0–32)
AST: 24 IU/L (ref 0–40)
Albumin: 4.9 g/dL — ABNORMAL HIGH (ref 3.5–4.8)
Alkaline Phosphatase: 53 IU/L (ref 39–117)
BUN/Creatinine Ratio: 10 — ABNORMAL LOW (ref 12–28)
BUN: 8 mg/dL (ref 8–27)
Bilirubin Total: 0.4 mg/dL (ref 0.0–1.2)
CALCIUM: 9.5 mg/dL (ref 8.7–10.3)
CO2: 24 mmol/L (ref 20–29)
Chloride: 99 mmol/L (ref 96–106)
Creatinine, Ser: 0.81 mg/dL (ref 0.57–1.00)
GFR, EST AFRICAN AMERICAN: 85 mL/min/{1.73_m2} (ref >59)
GFR, EST NON AFRICAN AMERICAN: 73 mL/min/{1.73_m2} (ref >59)
GLUCOSE: 79 mg/dL (ref 65–99)
Globulin, Total: 1.8 g/dL (ref 1.5–4.5)
Potassium: 4.3 mmol/L (ref 3.5–5.2)
Sodium: 140 mmol/L (ref 134–144)
TOTAL PROTEIN: 6.7 g/dL (ref 6.0–8.5)

## 2017-09-28 LAB — LIPID PANEL
CHOLESTEROL TOTAL: 255 mg/dL — AB (ref 100–199)
Chol/HDL Ratio: 2.3 ratio (ref 0.0–4.4)
HDL: 111 mg/dL (ref >39)
LDL Calculated: 132 mg/dL — ABNORMAL HIGH (ref 0–99)
TRIGLYCERIDES: 58 mg/dL (ref 0–149)
VLDL Cholesterol Cal: 12 mg/dL (ref 5–40)

## 2017-09-28 MED ORDER — PRAVASTATIN SODIUM 40 MG PO TABS: 40.0000 mg | ORAL_TABLET | Freq: Every day | ORAL | 1 refills | Status: DC

## 2017-09-28 NOTE — Telephone Encounter (Signed)
-----   Message from Birdie Sons, MD sent at 09/28/2017  7:40 AM EDT ----- Cholesterol is high at 255. This is strong risk for developing heart disease or strokes, recommend she start pravastatin 40mg  once a day, need to get cholesterol under 180. Follow up to check lipids in 3 month.

## 2017-09-28 NOTE — Telephone Encounter (Signed)
Patient advised. Patient agrees with treatment plan. 3 month follow up scheduled. She would like RX sent to [facility].   Did not state how many refills? Please advise.

## 2017-09-30 ENCOUNTER — Other Ambulatory Visit: Payer: Self-pay

## 2017-09-30 MED ORDER — NA SULFATE-K SULFATE-MG SULF 17.5-3.13-1.6 GM/177ML PO SOLN: 1.0000 | Freq: Once | ORAL | 0 refills | Status: AC

## 2017-10-03 ENCOUNTER — Telehealth: Payer: Self-pay | Admitting: Family Medicine

## 2017-10-03 NOTE — Telephone Encounter (Signed)
Pt called from her test results that were on her My Chart.  CB# [phone]  Thanks [name]

## 2017-10-03 NOTE — Telephone Encounter (Signed)
Patient states she received a notification from my chart stating she had new test results, but was unable to look in my chart at this time.   Patient advised she has the labs that were done on 09/27/17. She was advised of those results on 09/28/17. She said she just wanted to make sure she did not have any more new results

## 2017-10-04 ENCOUNTER — Other Ambulatory Visit: Payer: Self-pay | Admitting: Gastroenterology

## 2017-10-21 ENCOUNTER — Other Ambulatory Visit: Payer: Self-pay | Admitting: Family Medicine

## 2017-10-21 DIAGNOSIS — Z1231 Encounter for screening mammogram for malignant neoplasm of breast: Secondary | ICD-10-CM

## 2017-10-21 DIAGNOSIS — E2839 Other primary ovarian failure: Secondary | ICD-10-CM

## 2017-10-26 ENCOUNTER — Other Ambulatory Visit: Payer: Medicare HMO

## 2017-10-28 ENCOUNTER — Encounter
Admission: RE | Disposition: A | Discharge status: Home / Self Care | Payer: Self-pay | Source: Ambulatory Visit | Attending: Gastroenterology

## 2017-10-28 ENCOUNTER — Ambulatory Visit: Payer: Medicare HMO | Admitting: Anesthesiology

## 2017-10-28 ENCOUNTER — Encounter: Payer: Self-pay | Admitting: *Deleted

## 2017-10-28 ENCOUNTER — Ambulatory Visit
Admission: RE | Admit: 2017-10-28 | Discharge: 2017-10-28 | Disposition: A | Discharge status: Home / Self Care | Payer: Medicare HMO | Source: Ambulatory Visit | Attending: Gastroenterology | Admitting: Gastroenterology

## 2017-10-28 DIAGNOSIS — Z86010 Personal history of colonic polyps: Secondary | ICD-10-CM | POA: Diagnosis not present

## 2017-10-28 DIAGNOSIS — K5730 Diverticulosis of large intestine without perforation or abscess without bleeding: Secondary | ICD-10-CM | POA: Diagnosis not present

## 2017-10-28 DIAGNOSIS — D123 Benign neoplasm of transverse colon: Secondary | ICD-10-CM

## 2017-10-28 DIAGNOSIS — Z79899 Other long term (current) drug therapy: Secondary | ICD-10-CM | POA: Insufficient documentation

## 2017-10-28 DIAGNOSIS — D122 Benign neoplasm of ascending colon: Secondary | ICD-10-CM | POA: Diagnosis not present

## 2017-10-28 DIAGNOSIS — K5790 Diverticulosis of intestine, part unspecified, without perforation or abscess without bleeding: Secondary | ICD-10-CM | POA: Diagnosis not present

## 2017-10-28 DIAGNOSIS — Z1211 Encounter for screening for malignant neoplasm of colon: Secondary | ICD-10-CM

## 2017-10-28 DIAGNOSIS — E7800 Pure hypercholesterolemia, unspecified: Secondary | ICD-10-CM | POA: Diagnosis not present

## 2017-10-28 DIAGNOSIS — K635 Polyp of colon: Secondary | ICD-10-CM | POA: Diagnosis not present

## 2017-10-28 HISTORY — PX: COLONOSCOPY WITH PROPOFOL: SHX5780

## 2017-10-28 SURGERY — COLONOSCOPY WITH PROPOFOL
Anesthesia: General

## 2017-10-28 MED ORDER — PROPOFOL 500 MG/50ML IV EMUL
INTRAVENOUS | Status: DC | PRN
  Administered 2017-10-28: 140 ug/kg/min via INTRAVENOUS

## 2017-10-28 MED ORDER — SODIUM CHLORIDE 0.9 % IV SOLN
INTRAVENOUS | Status: DC
  Administered 2017-10-28: 1000 mL via INTRAVENOUS

## 2017-10-28 MED ORDER — PROPOFOL 10 MG/ML IV BOLUS
INTRAVENOUS | Status: DC | PRN
  Administered 2017-10-28: 50 mg via INTRAVENOUS
  Administered 2017-10-28 (×3): 30 mg via INTRAVENOUS

## 2017-10-28 MED ORDER — PROPOFOL 500 MG/50ML IV EMUL
INTRAVENOUS | Status: AC
  Filled 2017-10-28: qty 50

## 2017-10-28 NOTE — H&P (Signed)
Jonathon Bellows, MD [address] [place], [place], [place], [address] [address], [place], [place], [place], [address] Phone: [phone]  Fax: [phone]  Primary Care Physician:  Birdie Sons, MD   Pre-Procedure History & Physical: HPI:  Kristin Small is a 72 y.o. female is here for an colonoscopy.   Past Medical History:  Diagnosis Date  . Diverticulosis   . History of chicken pox   . Hypercholesteremia     Past Surgical History:  Procedure Laterality Date  . ABDOMINAL HYSTERECTOMY  1990   for heavy bleeding  . BREAST SURGERY Left 2008   multiple breast cyst  . CESAREAN SECTION    . Tubular Adenoma of rectum  10/2002    Prior to Admission medications   Medication Sig Start Date End Date Taking? Authorizing Provider  Calcium 500 MG tablet Take 1,000 mg by mouth.    [provider]  fluticasone (FLONASE) 50 MCG/ACT nasal spray Place 2 sprays into both nostrils daily. Patient taking differently: Place 2 sprays into both nostrils daily.  05/14/16   Carmon Ginsberg, PA  Ginkgo Biloba (GINKOBA) 40 MG TABS Take 120 mg by mouth daily.    [provider]  Multiple Vitamins-Minerals (MULTIVITAMIN ADULT) TABS Take by mouth daily.    [provider]  pravastatin (PRAVACHOL) 40 MG tablet Take 1 tablet (40 mg total) by mouth daily. 09/28/17   Birdie Sons, MD  TURMERIC PO Take 500 mg by mouth daily.     [provider]    Allergies as of 09/28/2017 - Review Complete 09/27/2017  Allergen Reaction Noted  . Codeine Nausea Only 12/11/2014  . Penicillins Rash 12/11/2014    Family History  Problem Relation Age of Onset  . Thyroid disease Mother   . Stroke Mother   . Dementia Mother   . Atrial fibrillation Mother   . Aortic aneurysm Mother   . Esophageal cancer Father   . Pancreatitis Father   . Lung disease Father        restrictive lung disease  . Thyroid disease Sister   . Multiple myeloma Sister   . Heart attack  Maternal Grandfather   . Alzheimer's disease Other   . Alzheimer's disease Other     Social History   Socioeconomic History  . Marital status: Married    Spouse name: Not on file  . Number of children: 2  . Years of education: Not on file  . Highest education level: Associate degree: occupational, Hotel manager, or vocational program  Occupational History  . Occupation: Retired    Comment: Former Control and instrumentation engineer  Social Needs  . Financial resource strain: Not hard at all  . Food insecurity:    Worry: Never true    Inability: Never true  . Transportation needs:    Medical: No    Non-medical: No  Tobacco Use  . Smoking status: Never Smoker  . Smokeless tobacco: Never Used  Substance and Sexual Activity  . Alcohol use: Yes    Alcohol/week: 7.0 - 14.0 standard drinks    Types: 7 - 14 Glasses of wine per week  . Drug use: No  . Sexual activity: Not on file  Lifestyle  . Physical activity:    Days per week: Not on file    Minutes per session: Not on file  . Stress: Not at all  Relationships  . Social connections:    Talks on phone: Not on file  Gets together: Not on file    Attends religious service: Not on file    Active member of club or organization: Not on file    Attends meetings of clubs or organizations: Not on file    Relationship status: Not on file  . Intimate partner violence:    Fear of current or ex partner: Not on file    Emotionally abused: Not on file    Physically abused: Not on file    Forced sexual activity: Not on file  Other Topics Concern  . Not on file  Social History Narrative  . Not on file    Review of Systems: See HPI, otherwise negative ROS  Physical Exam: BP 135/82   Pulse 70   Temp (!) 97 F (36.1 C) (Tympanic)   Resp 16   Ht 5' 1"  (1.549 m)   Wt 47.2 kg   SpO2 100%   BMI 19.65 kg/m  General:   Alert,  pleasant and cooperative in NAD Head:  Normocephalic and atraumatic. Neck:  Supple; no masses or thyromegaly. Lungs:   Clear throughout to auscultation, normal respiratory effort.    Heart:  +S1, +S2, Regular rate and rhythm, No edema. Abdomen:  Soft, nontender and nondistended. Normal bowel sounds, without guarding, and without rebound.   Neurologic:  Alert and  oriented x4;  grossly normal neurologically.  Impression/Plan: Kristin Small is here for an colonoscopy to be performed for surveillance due to prior history of colon polyps   Risks, benefits, limitations, and alternatives regarding  colonoscopy have been reviewed with the patient.  Questions have been answered.  All parties agreeable.   Jonathon Bellows, MD  10/28/2017, 8:49 AM

## 2017-10-28 NOTE — Op Note (Signed)
[facility] Patient Name: Kristin Small Procedure Date: 10/28/2017 8:49 AM MRN: [id number] Account #: [id number] Date of Birth: 12-26-1945 Admit Type: Outpatient Age: 72 Room: [facility] ENDO ROOM 4 Gender: Female Note Status: Finalized Procedure:            Colonoscopy Indications:          High risk colon cancer surveillance: Personal history                        of colonic polyps Providers:            Jonathon Bellows MD, MD Referring MD:         Kirstie Peri. Caryn Section, MD (Referring MD) Medicines:            Monitored Anesthesia Care Complications:        No immediate complications. Procedure:            Pre-Anesthesia Assessment:                       - Prior to the procedure, a History and Physical was                        performed, and patient medications, allergies and                        sensitivities were reviewed. The patient's tolerance of                        previous anesthesia was reviewed.                       - The risks and benefits of the procedure and the                        sedation options and risks were discussed with the                        patient. All questions were answered and informed                        consent was obtained.                       - ASA Grade Assessment: II - A patient with mild                        systemic disease.                       After obtaining informed consent, the colonoscope was                        passed under direct vision. Throughout the procedure,                        the patient's blood pressure, pulse, and oxygen                        saturations were monitored continuously. The  Colonoscope was introduced through the anus and                        advanced to the the cecum, identified by the                        appendiceal orifice, IC valve and transillumination.                        The colonoscopy was performed with ease. The patient               tolerated the procedure well. The quality of the bowel                        preparation was good. Findings:      The perianal and digital rectal examinations were normal.      Two sessile polyps were found in the ascending colon. The polyps were 3       to 4 mm in size. These polyps were removed with a cold biopsy forceps.       Resection and retrieval were complete.      Three sessile polyps were found in the transverse colon. The polyps were       3 to 4 mm in size. These polyps were removed with a cold biopsy forceps.       Resection and retrieval were complete.      Multiple small-mouthed diverticula were found in the sigmoid colon.      The exam was otherwise without abnormality on direct and retroflexion       views. Impression:           - Two 3 to 4 mm polyps in the ascending colon, removed                        with a cold biopsy forceps. Resected and retrieved.                       - Three 3 to 4 mm polyps in the transverse colon,                        removed with a cold biopsy forceps. Resected and                        retrieved.                       - Diverticulosis in the sigmoid colon.                       - The examination was otherwise normal on direct and                        retroflexion views. Recommendation:       - Discharge patient to home (with escort).                       - Resume previous diet.                       - Continue present medications.                       -  Await pathology results.                       - Repeat colonoscopy in 3 - 5 years for surveillance                        based on pathology results. Procedure Code(s):    --- Professional ---                       [phone], Colonoscopy, flexible; with biopsy, single or                        multiple Diagnosis Code(s):    --- Professional ---                       Z86.010, Personal history of colonic polyps                       D12.2, Benign neoplasm of ascending  colon                       D12.3, Benign neoplasm of transverse colon (hepatic                        flexure or splenic flexure)                       K57.30, Diverticulosis of large intestine without                        perforation or abscess without bleeding CPT copyright 2017 [employer]. All rights reserved. The codes documented in this report are preliminary and upon coder review may  be revised to meet current compliance requirements. Jonathon Bellows, MD Jonathon Bellows MD, MD 10/28/2017 9:30:24 AM This report has been signed electronically. Number of Addenda: 0 Note Initiated On: 10/28/2017 8:49 AM Scope Withdrawal Time: 0 hours 22 minutes 29 seconds  Total Procedure Duration: 0 hours 28 minutes 1 second       [facility]

## 2017-10-28 NOTE — Anesthesia Postprocedure Evaluation (Signed)
Anesthesia Post Note  Patient: Kristin Small  Procedure(s) Performed: COLONOSCOPY WITH PROPOFOL (N/A )  Patient location during evaluation: Endoscopy Anesthesia Type: General Level of consciousness: awake and alert Pain management: pain level controlled Vital Signs Assessment: post-procedure vital signs reviewed and stable Respiratory status: spontaneous breathing, nonlabored ventilation, respiratory function stable and patient connected to nasal cannula oxygen Cardiovascular status: blood pressure returned to baseline and stable Postop Assessment: no apparent nausea or vomiting Anesthetic complications: no     Last Vitals:  Vitals:   10/28/17 1000 10/28/17 1009  BP:  138/81  Pulse: (!) 58 (!) 56  Resp: 19 18  Temp:    SpO2: 100% 99%    Last Pain:  Vitals:   10/28/17 0932  TempSrc: Tympanic  PainSc:                  [name]

## 2017-10-28 NOTE — Anesthesia Post-op Follow-up Note (Signed)
Anesthesia QCDR form completed.        

## 2017-10-28 NOTE — Anesthesia Preprocedure Evaluation (Addendum)
Anesthesia Evaluation  Patient identified by MRN, date of birth, ID band Patient awake    Reviewed: Allergy & Precautions, H&P , NPO status , Patient's Chart, lab work & pertinent test results  History of Anesthesia Complications (+) PROLONGED EMERGENCE, Emergence Delirium and history of anesthetic complications  Airway Mallampati: III  TM Distance: >3 FB Neck ROM: limited    Dental  (+) Chipped, Poor Dentition   Pulmonary neg pulmonary ROS, neg shortness of breath,           Cardiovascular Exercise Tolerance: Good (-) angina+ Peripheral Vascular Disease  (-) Past MI      Neuro/Psych negative neurological ROS  negative psych ROS   GI/Hepatic negative GI ROS, Neg liver ROS, neg GERD  ,  Endo/Other  negative endocrine ROS  Renal/GU negative Renal ROS  negative genitourinary   Musculoskeletal   Abdominal   Peds  Hematology negative hematology ROS (+)   Anesthesia Other Findings Past Medical History: No date: Diverticulosis No date: History of chicken pox No date: Hypercholesteremia  Past Surgical History: 1990: ABDOMINAL HYSTERECTOMY     Comment:  for heavy bleeding 2008: BREAST SURGERY; Left     Comment:  multiple breast cyst No date: CESAREAN SECTION 10/2002: Tubular Adenoma of rectum  BMI    Body Mass Index:  19.65 kg/m      Reproductive/Obstetrics negative OB ROS                            Anesthesia Physical Anesthesia Plan  ASA: II  Anesthesia Plan: General   Post-op Pain Management:    Induction: Intravenous  PONV Risk Score and Plan: Propofol infusion and TIVA  Airway Management Planned: Natural Airway and Nasal Cannula  Additional Equipment:   Intra-op Plan:   Post-operative Plan:   Informed Consent: I have reviewed the patients History and Physical, chart, labs and discussed the procedure including the risks, benefits and alternatives for the proposed  anesthesia with the patient or authorized representative who has indicated his/her understanding and acceptance.   Dental Advisory Given  Plan Discussed with: Anesthesiologist, CRNA and Surgeon  Anesthesia Plan Comments: (Patient and family informed that patient is higher risk for complications from anesthesia during this procedure due to their anesthetic history and age including but not limited to post operative cognitive dysfunction.  They voiced understanding.   Patient consented for risks of anesthesia including but not limited to:  - adverse reactions to medications - risk of intubation if required - damage to teeth, lips or other oral mucosa - sore throat or hoarseness - Damage to heart, brain, lungs or loss of life  Patient voiced understanding.)       Anesthesia Quick Evaluation

## 2017-10-28 NOTE — Transfer of Care (Signed)
Immediate Anesthesia Transfer of Care Note  Patient: Kristin Small  Procedure(s) Performed: COLONOSCOPY WITH PROPOFOL (N/A )  Patient Location: Endoscopy Unit  Anesthesia Type:General  Level of Consciousness: sedated  Airway & Oxygen Therapy: Patient Spontanous Breathing and Patient connected to nasal cannula oxygen  Post-op Assessment: Report given to RN and Post -op Vital signs reviewed and stable  Post vital signs: Reviewed and stable  Last Vitals:  Vitals Value Taken Time  BP    Temp    Pulse    Resp    SpO2      Last Pain:  Vitals:   10/28/17 0823  TempSrc: Tympanic  PainSc: 0-No pain         Complications: No apparent anesthesia complications

## 2017-10-29 ENCOUNTER — Encounter: Payer: Self-pay | Admitting: Gastroenterology

## 2017-11-01 ENCOUNTER — Telehealth: Payer: Self-pay | Admitting: Gastroenterology

## 2017-11-01 NOTE — Telephone Encounter (Signed)
Pt left vm to receiver her test results from Fridays procedure please call pt

## 2017-11-02 ENCOUNTER — Ambulatory Visit
Admission: RE | Admit: 2017-11-02 | Discharge: 2017-11-02 | Disposition: A | Discharge status: Home / Self Care | Payer: Medicare HMO | Source: Ambulatory Visit | Attending: Family Medicine | Admitting: Family Medicine

## 2017-11-02 ENCOUNTER — Encounter: Payer: Self-pay | Admitting: Gastroenterology

## 2017-11-02 DIAGNOSIS — M85851 Other specified disorders of bone density and structure, right thigh: Secondary | ICD-10-CM | POA: Diagnosis not present

## 2017-11-02 DIAGNOSIS — M8580 Other specified disorders of bone density and structure, unspecified site: Secondary | ICD-10-CM | POA: Insufficient documentation

## 2017-11-02 DIAGNOSIS — E2839 Other primary ovarian failure: Secondary | ICD-10-CM

## 2017-11-02 DIAGNOSIS — Z780 Asymptomatic menopausal state: Secondary | ICD-10-CM | POA: Diagnosis not present

## 2017-11-02 DIAGNOSIS — Z1231 Encounter for screening mammogram for malignant neoplasm of breast: Secondary | ICD-10-CM

## 2017-11-02 LAB — SURGICAL PATHOLOGY

## 2017-11-03 ENCOUNTER — Telehealth: Payer: Self-pay | Admitting: Family Medicine

## 2017-11-03 NOTE — Telephone Encounter (Signed)
Pt called asking for results from her bone density test.  CB#  [phone]  Thanks  [name]

## 2017-11-04 NOTE — Telephone Encounter (Signed)
Patient was notified of results. Expressed understanding.  

## 2017-11-11 NOTE — Telephone Encounter (Signed)
Result letter has been mailed to pt.

## 2017-11-21 ENCOUNTER — Telehealth: Payer: Self-pay | Admitting: Family Medicine

## 2017-11-21 NOTE — Telephone Encounter (Signed)
Please advise 

## 2017-11-21 NOTE — Telephone Encounter (Signed)
Pt called saying she received a letter from mammography saying her mammogram showed extreme density and told her to fu with her primary.  Please advise  CB#  [phone]  Thanks  [name]

## 2017-11-21 NOTE — Telephone Encounter (Signed)
3D Mammogram shows that her breasts are more dense than most women her age. This occurs in about 10 % of women. There is nothing wrong with having dense breasts, it just makes it more difficult for radiologist to read mammogram. So it is very important that she has thorough breast exam and get 3D mammograms every year.

## 2017-11-22 NOTE — Telephone Encounter (Signed)
Patient advised as below. Patient verbalizes understanding and is in agreement with treatment plan.  

## 2017-11-26 ENCOUNTER — Ambulatory Visit (INDEPENDENT_AMBULATORY_CARE_PROVIDER_SITE_OTHER): Payer: Medicare HMO

## 2017-11-26 DIAGNOSIS — Z23 Encounter for immunization: Secondary | ICD-10-CM

## 2017-11-29 DIAGNOSIS — R69 Illness, unspecified: Secondary | ICD-10-CM | POA: Diagnosis not present

## 2017-11-30 ENCOUNTER — Ambulatory Visit: Payer: Medicare HMO

## 2017-12-29 ENCOUNTER — Ambulatory Visit (INDEPENDENT_AMBULATORY_CARE_PROVIDER_SITE_OTHER): Payer: Medicare HMO | Admitting: Family Medicine

## 2017-12-29 ENCOUNTER — Encounter: Payer: Self-pay | Admitting: Family Medicine

## 2017-12-29 VITALS — BP 126/70 | HR 64 | Temp 97.7°F | Resp 16 | Wt 113.0 lb

## 2017-12-29 DIAGNOSIS — E785 Hyperlipidemia, unspecified: Secondary | ICD-10-CM

## 2017-12-29 DIAGNOSIS — M25542 Pain in joints of left hand: Secondary | ICD-10-CM

## 2017-12-29 DIAGNOSIS — M25541 Pain in joints of right hand: Secondary | ICD-10-CM

## 2017-12-29 DIAGNOSIS — M151 Heberden's nodes (with arthropathy): Secondary | ICD-10-CM

## 2017-12-29 DIAGNOSIS — M65311 Trigger thumb, right thumb: Secondary | ICD-10-CM | POA: Diagnosis not present

## 2017-12-29 MED ORDER — DICLOFENAC SODIUM 1 % TD GEL: 2.0000 g | Freq: Three times a day (TID) | TRANSDERMAL | 3 refills | Status: AC

## 2017-12-29 NOTE — Patient Instructions (Signed)
   Call for referral to orthopedist if thumb is not much better within a month.

## 2017-12-29 NOTE — Progress Notes (Signed)
Patient: Kristin Small Female    DOB: 04-05-1945   72 y.o.   MRN: [id number] Visit Date: 12/29/2017  Today's Provider: Lelon Huh, MD   Chief Complaint  Patient presents with  . Hyperlipidemia    Three month follow up, pt started pravastatin 40mg  a day.    Subjective:    Hyperlipidemia  This is a chronic problem. The problem is uncontrolled. Recent lipid tests were reviewed and are high. Pertinent negatives include no chest pain, focal sensory loss, focal weakness, leg pain, myalgias or shortness of breath. Current antihyperlipidemic treatment includes statins. There are no compliance problems.      Lab Results  Component Value Date   CHOL 255 (H) 09/27/2017   CHOL 223 (H) 10/07/2016   CHOL 268 (H) 12/12/2014   Lab Results  Component Value Date   HDL 111 09/27/2017   HDL 96 10/07/2016   HDL 98 12/12/2014   Lab Results  Component Value Date   LDLCALC 132 (H) 09/27/2017   LDLCALC 113 (H) 10/07/2016   LDLCALC 155 (H) 12/12/2014   Lab Results  Component Value Date   TRIG 58 09/27/2017   TRIG 71 10/07/2016   TRIG 76 12/12/2014   Lab Results  Component Value Date   CHOLHDL 2.3 09/27/2017   CHOLHDL 2.3 10/07/2016   CHOLHDL 2.7 12/12/2014   No results found for: LDLDIRECT   She also states that over the last month or two her right thumb catches when she tries to extended it. It has been more swollen and is a little painful. She does not take any OTC medications .she has also had some pain in wrist  And back of 1st MCP    Allergies  Allergen Reactions  . Codeine Nausea Only    Other reaction(s): Vomiting  . Penicillins Rash     Current Outpatient Medications:  .  Calcium 500 MG tablet, Take 1,000 mg by mouth., Disp: , Rfl:  .  fluticasone (FLONASE) 50 MCG/ACT nasal spray, Place 2 sprays into both nostrils daily. (Patient taking differently: Place 2 sprays into both nostrils daily. ), Disp: 16 g, Rfl: 6 .  Ginkgo Biloba (GINKOBA) 40 MG TABS, Take 120  mg by mouth daily., Disp: , Rfl:  .  Multiple Vitamins-Minerals (MULTIVITAMIN ADULT) TABS, Take by mouth daily., Disp: , Rfl:  .  pravastatin (PRAVACHOL) 40 MG tablet, Take 1 tablet (40 mg total) by mouth daily., Disp: 90 tablet, Rfl: 1 .  TURMERIC PO, Take 500 mg by mouth daily. , Disp: , Rfl:   Review of Systems  Constitutional: Negative.   Respiratory: Negative.  Negative for shortness of breath.   Cardiovascular: Negative.  Negative for chest pain.  Gastrointestinal: Negative.   Musculoskeletal: Negative.  Negative for myalgias.  Neurological: Negative for dizziness, focal weakness, light-headedness and headaches.    Social History   Tobacco Use  . Smoking status: Never Smoker  . Smokeless tobacco: Never Used  Substance Use Topics  . Alcohol use: Yes    Alcohol/week: 7.0 - 14.0 standard drinks    Types: 7 - 14 Glasses of wine per week   Objective:   BP 126/70 (BP Location: Right Arm, Patient Position: Sitting, Cuff Size: Normal)   Pulse 64   Temp 97.7 F (36.5 C) (Oral)   Resp 16   Wt 113 lb (51.3 kg)   BMI 21.35 kg/m  Vitals:   12/29/17 0822  BP: 126/70  Pulse: 64  Resp: 16  Temp:  97.7 F (36.5 C)  TempSrc: Oral  Weight: 113 lb (51.3 kg)     Physical Exam  General appearance: alert, well developed, well nourished, cooperative and in no distress Head: Normocephalic, without obvious abnormality, atraumatic Respiratory: Respirations even and unlabored, normal respiratory rate Extremities: Trigger thumb right IP joint. Moderate swelling of right 1st IP and MCP. heberden's nodes noted across DIPS of both hands. Mild tenderness of right wrist.      Assessment & Plan:     1. Hyperlipidemia, unspecified hyperlipidemia type Doing well with pravastatin, check labs.  - Comprehensive metabolic panel - Lipid panel  2. Trigger thumb of right hand Counseled of trial of antiinflammatories versus orthopedic surgery referral. She does not like take taking pills but is  interested in topical treatment.  - diclofenac sodium (VOLTAREN) 1 % GEL; Apply 2 g topically 3 (three) times daily.  Dispense: 100 g; Refill: 3 She is to call for orthopedic referral if not significantly improved within a month.   3. Heberden's nodes (with arthropathy)  - diclofenac sodium (VOLTAREN) 1 % GEL; Apply 2 g topically 3 (three) times daily.  Dispense: 100 g; Refill: 3  4. Arthralgia of both hands  - diclofenac sodium (VOLTAREN) 1 % GEL; Apply 2 g topically 3 (three) times daily.  Dispense: 100 g; Refill: 3 - Rheumatoid Factor       Lelon Huh, MD  [facility]

## 2017-12-30 LAB — RHEUMATOID FACTOR

## 2017-12-30 LAB — LIPID PANEL
CHOL/HDL RATIO: 1.9 ratio (ref 0.0–4.4)
Cholesterol, Total: 191 mg/dL (ref 100–199)
HDL: 100 mg/dL (ref >39)
LDL CALC: 81 mg/dL (ref 0–99)
Triglycerides: 49 mg/dL (ref 0–149)
VLDL Cholesterol Cal: 10 mg/dL (ref 5–40)

## 2017-12-30 LAB — COMPREHENSIVE METABOLIC PANEL
ALK PHOS: 47 IU/L (ref 39–117)
ALT: 18 IU/L (ref 0–32)
AST: 23 IU/L (ref 0–40)
Albumin/Globulin Ratio: 2.2 (ref 1.2–2.2)
Albumin: 4.6 g/dL (ref 3.5–4.8)
BILIRUBIN TOTAL: 0.4 mg/dL (ref 0.0–1.2)
BUN / CREAT RATIO: 13 (ref 12–28)
BUN: 10 mg/dL (ref 8–27)
CO2: 24 mmol/L (ref 20–29)
Calcium: 9.5 mg/dL (ref 8.7–10.3)
Chloride: 102 mmol/L (ref 96–106)
Creatinine, Ser: 0.77 mg/dL (ref 0.57–1.00)
GFR calc Af Amer: 90 mL/min/{1.73_m2} (ref >59)
GFR calc non Af Amer: 78 mL/min/{1.73_m2} (ref >59)
GLUCOSE: 80 mg/dL (ref 65–99)
Globulin, Total: 2.1 g/dL (ref 1.5–4.5)
POTASSIUM: 4.1 mmol/L (ref 3.5–5.2)
SODIUM: 141 mmol/L (ref 134–144)
TOTAL PROTEIN: 6.7 g/dL (ref 6.0–8.5)

## 2018-03-25 ENCOUNTER — Other Ambulatory Visit: Payer: Self-pay | Admitting: Family Medicine

## 2018-03-30 DIAGNOSIS — H2513 Age-related nuclear cataract, bilateral: Secondary | ICD-10-CM | POA: Diagnosis not present

## 2018-08-10 DIAGNOSIS — R69 Illness, unspecified: Secondary | ICD-10-CM | POA: Diagnosis not present

## 2018-09-29 ENCOUNTER — Encounter: Payer: Self-pay | Admitting: Family Medicine

## 2018-09-29 ENCOUNTER — Other Ambulatory Visit: Payer: Self-pay | Admitting: Family Medicine

## 2018-09-29 ENCOUNTER — Ambulatory Visit: Payer: Medicare HMO

## 2018-09-29 ENCOUNTER — Other Ambulatory Visit: Payer: Self-pay

## 2018-09-29 ENCOUNTER — Ambulatory Visit (INDEPENDENT_AMBULATORY_CARE_PROVIDER_SITE_OTHER): Payer: Medicare HMO | Admitting: Family Medicine

## 2018-09-29 VITALS — BP 120/60 | HR 59 | Temp 98.0°F | Resp 16 | Wt 109.0 lb

## 2018-09-29 DIAGNOSIS — Z1231 Encounter for screening mammogram for malignant neoplasm of breast: Secondary | ICD-10-CM

## 2018-09-29 DIAGNOSIS — E785 Hyperlipidemia, unspecified: Secondary | ICD-10-CM

## 2018-09-29 DIAGNOSIS — Z0000 Encounter for general adult medical examination without abnormal findings: Secondary | ICD-10-CM | POA: Diagnosis not present

## 2018-09-29 DIAGNOSIS — M25542 Pain in joints of left hand: Secondary | ICD-10-CM | POA: Diagnosis not present

## 2018-09-29 DIAGNOSIS — M8580 Other specified disorders of bone density and structure, unspecified site: Secondary | ICD-10-CM | POA: Diagnosis not present

## 2018-09-29 DIAGNOSIS — M791 Myalgia, unspecified site: Secondary | ICD-10-CM

## 2018-09-29 DIAGNOSIS — M25541 Pain in joints of right hand: Secondary | ICD-10-CM

## 2018-09-29 DIAGNOSIS — M899 Disorder of bone, unspecified: Secondary | ICD-10-CM | POA: Diagnosis not present

## 2018-09-29 NOTE — Progress Notes (Signed)
Patient: Kristin Small, Female    DOB: 1946/02/19, 73 y.o.   MRN: [id number] Visit Date: 09/29/2018  Today's Provider: Lelon Huh, MD   Chief Complaint  Patient presents with  . Medicare Wellness  . Annual Exam   Subjective:     Annual physical exam Kristin Small is a 73 y.o. female who presents today for health maintenance and complete physical. She feels well. She reports exercising frequently. She reports she is sleeping well. She is moving to [facility] to be closer to her daughter and grandchildren which she is very excited about. 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  -----------------------------------------------------------------   Review of Systems  Constitutional: Negative for chills, diaphoresis and fever.  HENT: Negative for congestion, ear discharge, ear pain, hearing loss, nosebleeds, sore throat and tinnitus.   Eyes: Negative for photophobia, pain, discharge and redness.  Respiratory: Negative for cough, shortness of breath, wheezing and stridor.   Cardiovascular: Negative for chest pain, palpitations and leg swelling.  Gastrointestinal: Negative for abdominal pain, blood in stool, constipation, diarrhea, nausea and vomiting.  Endocrine: Negative for polydipsia.  Genitourinary: Negative for dysuria, flank pain, frequency, hematuria and urgency.  Musculoskeletal: Positive for arthralgias and myalgias. Negative for back pain and neck pain.  Skin: Negative for rash.  Allergic/Immunologic: Negative for environmental allergies.  Neurological: Negative for dizziness, tremors, seizures, weakness and headaches.  Hematological: Does not bruise/bleed easily.  Psychiatric/Behavioral: Negative for hallucinations and suicidal ideas. The patient is not nervous/anxious.     Social History      She  reports that she has never smoked. She has never used smokeless tobacco. She  reports current alcohol use of about 7.0 - 14.0 standard drinks of alcohol per week. She reports that she does not use drugs.       Social History   Socioeconomic History  . Marital status: Married    Spouse name: Not on file  . Number of children: 2  . Years of education: Not on file  . Highest education level: Associate degree: occupational, Hotel manager, or vocational program  Occupational History  . Occupation: Retired    Comment: Former Control and instrumentation engineer  Social Needs  . Financial resource strain: Not hard at all  . Food insecurity    Worry: Never true    Inability: Never true  . Transportation needs    Medical: No    Non-medical: No  Tobacco Use  . Smoking status: Never Smoker  . Smokeless tobacco: Never Used  Substance and Sexual Activity  . Alcohol use: Yes    Alcohol/week: 7.0 - 14.0 standard drinks    Types: 7 - 14 Glasses of wine per week  . Drug use: No  . Sexual activity: Not on file  Lifestyle  . Physical activity    Days per week: Not on file    Minutes per session: Not on file  . Stress: Not at all  Relationships  . Social Herbalist on phone: Not on file    Gets together: Not on file    Attends religious service: Not on file    Active member of club or organization: Not on file    Attends meetings of clubs or organizations: Not on file    Relationship status: Not on file  Other Topics Concern  . Not on file  Social  History Narrative  . Not on file    Past Medical History:  Diagnosis Date  . History of chicken pox      Patient Active Problem List   Diagnosis Date Noted  . Trigger thumb of right hand 12/29/2017  . 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 Osteopenia 11/02/2017  . Hyperlipidemia 10/06/2016  . Varicose veins of both lower extremities 10/06/2016  . Presbycusis 10/06/2016  . S/P hysterectomy 12/13/2014  . Diverticulosis 12/11/2014  . History of adenomatous polyp of colon 02/03/2006    Past Surgical History:   Procedure Laterality Date  . ABDOMINAL HYSTERECTOMY  1990   for heavy bleeding  . BREAST BIOPSY     multiple cysts  . BREAST SURGERY Left 2008   multiple breast cyst  . CESAREAN SECTION    . COLONOSCOPY WITH PROPOFOL N/A 10/28/2017   Procedure: COLONOSCOPY WITH PROPOFOL;  Surgeon: Jonathon Bellows, MD;  Location: [facility] ENDOSCOPY;  Service: Gastroenterology;  Laterality: N/A;  . Tubular Adenoma of rectum  10/2002    Family History        Family Status  Relation Name Status  . Mother  Deceased       Pacemaker  . Father  Deceased  . Sister  Deceased  . MGF  Deceased at age 10       MI  . Other Aunt (Not Specified)  . Other Grandmother (Not Specified)  . Neg Hx  (Not Specified)        Her family history includes Alzheimer's disease in some other family members; Aortic aneurysm in her mother; Atrial fibrillation in her mother; Dementia in her mother; Esophageal cancer in her father; Heart attack in her maternal grandfather; Lung disease in her father; Multiple myeloma in her sister; Pancreatitis in her father; Stroke in her mother; Thyroid disease in her mother and sister. There is no history of Breast cancer.      Allergies  Allergen Reactions  . Codeine Nausea Only    Other reaction(s): Vomiting  . Penicillins Rash     Current Outpatient Medications:  .  Calcium 500 MG tablet, Take 1,000 mg by mouth., Disp: , Rfl:  .  Cholecalciferol 25 MCG (1000 UT) tablet, Take 2,000 Units by mouth daily., Disp: , Rfl:  .  diclofenac sodium (VOLTAREN) 1 % GEL, Apply 2 g topically 3 (three) times daily., Disp: 100 g, Rfl: 3 .  fluticasone (FLONASE) 50 MCG/ACT nasal spray, Place 2 sprays into both nostrils daily. (Patient taking differently: Place 2 sprays into both nostrils daily. ), Disp: 16 g, Rfl: 6 .  Ginkgo Biloba (GINKOBA) 40 MG TABS, Take 120 mg by mouth daily., Disp: , Rfl:  .  Multiple Vitamins-Minerals (MULTIVITAMIN ADULT) TABS, Take by mouth daily., Disp: , Rfl:  .  pravastatin  (PRAVACHOL) 40 MG tablet, TAKE 1 TABLET BY MOUTH EVERY DAY, Disp: 90 tablet, Rfl: 3 .  TURMERIC PO, Take 500 mg by mouth daily. , Disp: , Rfl:    Patient Care Team: Birdie Sons, MD as PCP - General (Family Medicine) Pa, [place] as Consulting Physician (Optometry) Dew, Erskine Squibb, MD as Referring Physician (Vascular Surgery)    Objective:    Vitals: BP 120/60 (BP Location: Right Arm, Patient Position: Sitting, Cuff Size: Normal)   Pulse (!) 59   Temp 98 F (36.7 C) (Oral)   Resp 16   Wt 109 lb (49.4 kg)   SpO2 97% Comment: room air  BMI 20.60 kg/m    Vitals:  09/29/18 1018  BP: 120/60  Pulse: (!) 59  Resp: 16  Temp: 98 F (36.7 C)  TempSrc: Oral  SpO2: 97%  Weight: 109 lb (49.4 kg)     Physical Exam   General Appearance:    Alert, cooperative, no distress, appears stated age  Head:    Normocephalic, without obvious abnormality, atraumatic  Eyes:    PERRL, conjunctiva/corneas clear, EOM's intact, fundi    benign, both eyes  Ears:    Normal TM's and external ear canals, both ears  Nose:   Nares normal, septum midline, mucosa normal, no drainage    or sinus tenderness  Throat:   Lips, mucosa, and tongue normal; teeth and gums normal  Neck:   Supple, symmetrical, trachea midline, no adenopathy;    thyroid:  no enlargement/tenderness/nodules; no carotid   bruit or JVD  Back:     Symmetric, no curvature, ROM normal, no CVA tenderness  Lungs:     Clear to auscultation bilaterally, respirations unlabored  Chest Wall:    No tenderness or deformity   Heart:    Normal heart rate. Normal rhythm. No murmurs, rubs, or gallops.   Breast Exam:    normal appearance, no masses or tenderness  Abdomen:     Soft, non-tender, bowel sounds active all four quadrants,    no masses, no organomegaly  Pelvic:    deferred  Extremities:   Extremities normal, atraumatic, no cyanosis or edema. Heberden's nodes across joints of all all upper extremity digits.   Pulses:   2+ and  symmetric all extremities  Skin:   Skin color, texture, turgor normal, no rashes or lesions  Lymph nodes:   Cervical, supraclavicular, and axillary nodes normal  Neurologic:   CNII-XII intact, normal strength, sensation and reflexes    throughout    Depression Screen PHQ 2/9 Scores 09/29/2018 09/27/2017 08/26/2016 08/26/2016  PHQ - 2 Score 0 0 1 1  PHQ- 9 Score - 1 3 -       Assessment & Plan:     Routine Health Maintenance and Physical Exam  Exercise Activities and Dietary recommendations Goals    . DIET - REDUCE SUGAR INTAKE     Recommend to monitor daily sugar intake and substitute for fruits.     . Reduce alcohol intake to 0-1 servings per day     Recommend decreasing wine intake to 0-1 glasses a day.        Immunization History  Administered Date(s) Administered  . Influenza, High Dose Seasonal PF 11/23/2014, 12/05/2015, 11/18/2016, 11/26/2017  . Pneumococcal Conjugate-13 11/23/2014  . Pneumococcal Polysaccharide-23 12/05/2015  . Tdap 09/06/2007, 12/24/2011  . Zoster 06/20/2012    Health Maintenance  Topic Date Due  . INFLUENZA VACCINE  10/07/2018  . MAMMOGRAM  11/03/2018  . COLONOSCOPY  10/28/2020  . DEXA SCAN  11/02/2020  . TETANUS/TDAP  12/23/2021  . Hepatitis C Screening  Completed  . PNA vac Low Risk Adult  Completed     Discussed health benefits of physical activity, and encouraged her to engage in regular exercise appropriate for her age and condition.    -------------------------------------------------------------------- 1. Annual physical exam Doing very well, excited about upcoming move to [facility]. Given information to schedule mammogram.  - Comprehensive metabolic panel - Lipid panel - VITAMIN D 25 Hydroxy (Vit-D Deficiency, Fractures) - CK (Creatine Kinase)  2. Hyperlipidemia, unspecified hyperlipidemia type She is tolerating pravastatin, although having some muscle aches. Will check CK.  - Comprehensive metabolic panel - Lipid panel -  CK (Creatine Kinase)  3. Osteopenia, unspecified location BMD in 2022 - VITAMIN D 25 Hydroxy (Vit-D Deficiency, Fractures)  4. Arthralgia of both hands Can use Voltaren gel prn.   5. Myalgia Check CK as above.      Lelon Huh, MD  [facility]

## 2018-09-29 NOTE — Patient Instructions (Signed)
.   Please review the attached list of medications and notify my office if there are any errors.   . Please bring all of your medications to every appointment so we can make sure that our medication list is the same as yours.   . We will have flu vaccines available after Labor Day. Please go to your pharmacy or call the office in early September to schedule you flu shot.   

## 2018-09-29 NOTE — Progress Notes (Signed)
Patient: Kristin Small, Female    DOB: May 29, 1945, 73 y.o.   MRN: [id number] Visit Date: 09/29/2018  Today's Provider: Lelon Huh, MD   Chief Complaint  Patient presents with  . Medicare Wellness   Subjective:     Annual wellness visit Kristin Small is a 73 y.o. female. She feels fairly well. She reports exercising 5 times a week. She reports she is sleeping fairly well.  -----------------------------------------------------------  Lipid/Cholesterol, Follow-up:   Last seen for this1 years ago.  Management changes since that visit include none. . Last Lipid Panel:    Component Value Date/Time   CHOL 191 12/29/2017 0854   TRIG 49 12/29/2017 0854   HDL 100 12/29/2017 0854   CHOLHDL 1.9 12/29/2017 0854   LDLCALC 81 12/29/2017 0854    Risk factors for vascular disease include hypercholesterolemia  She reports good compliance with treatment. She is not having side effects.  Current symptoms include none and have been stable. Weight trend: fluctuating a bit Prior visit with dietician: no Current diet: well balanced Current exercise: walking  Wt Readings from Last 3 Encounters:  09/29/18 109 lb (49.4 kg)  12/29/17 113 lb (51.3 kg)  10/28/17 104 lb (47.2 kg)    -------------------------------------------------------------------  Follow up for Osteopenia:  The patient was last seen for this 1 years ago. Changes made at last visit include advising patient to start taking Vitamin D 1,000units daily.  She reports good compliance with treatment. She feels that condition is Unchanged. She is not having side effects.   ------------------------------------------------------------------------------------     Social History   Socioeconomic History  . Marital status: Married    Spouse name: Not on file  . Number of children: 2  . Years of education: Not on file  . Highest education level: Associate degree: occupational, Hotel manager, or vocational program   Occupational History  . Occupation: Retired    Comment: Former Control and instrumentation engineer  Social Needs  . Financial resource strain: Not hard at all  . Food insecurity    Worry: Never true    Inability: Never true  . Transportation needs    Medical: No    Non-medical: No  Tobacco Use  . Smoking status: Never Smoker  . Smokeless tobacco: Never Used  Substance and Sexual Activity  . Alcohol use: Yes    Alcohol/week: 7.0 - 14.0 standard drinks    Types: 7 - 14 Glasses of wine per week  . Drug use: No  . Sexual activity: Not on file  Lifestyle  . Physical activity    Days per week: Not on file    Minutes per session: Not on file  . Stress: Not at all  Relationships  . Social Herbalist on phone: Not on file    Gets together: Not on file    Attends religious service: Not on file    Active member of club or organization: Not on file    Attends meetings of clubs or organizations: Not on file    Relationship status: Not on file  . Intimate partner violence    Fear of current or ex partner: Not on file    Emotionally abused: Not on file    Physically abused: Not on file    Forced sexual activity: Not on file  Other Topics Concern  . Not on file  Social History Narrative  . Not on file    Past Medical History:  Diagnosis Date  . History  of chicken pox      Patient Active Problem List   Diagnosis Date Noted  . Trigger thumb of right hand 12/29/2017  . 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 Hyperlipidemia 10/06/2016  . Varicose veins of both lower extremities 10/06/2016  . Presbycusis 10/06/2016  . S/P hysterectomy 12/13/2014  . Diverticulosis 12/11/2014  . History of adenomatous polyp of colon 02/03/2006    Past Surgical History:  Procedure Laterality Date  . ABDOMINAL HYSTERECTOMY  1990   for heavy bleeding  . BREAST BIOPSY     multiple cysts  . BREAST SURGERY Left 2008   multiple breast cyst  . CESAREAN SECTION    . COLONOSCOPY WITH PROPOFOL  N/A 10/28/2017   Procedure: COLONOSCOPY WITH PROPOFOL;  Surgeon: Jonathon Bellows, MD;  Location: [facility] ENDOSCOPY;  Service: Gastroenterology;  Laterality: N/A;  . Tubular Adenoma of rectum  10/2002    Her family history includes Alzheimer's disease in some other family members; Aortic aneurysm in her mother; Atrial fibrillation in her mother; Dementia in her mother; Esophageal cancer in her father; Heart attack in her maternal grandfather; Lung disease in her father; Multiple myeloma in her sister; Pancreatitis in her father; Stroke in her mother; Thyroid disease in her mother and sister. There is no history of Breast cancer.   Current Outpatient Medications:  .  Calcium 500 MG tablet, Take 1,000 mg by mouth., Disp: , Rfl:  .  Cholecalciferol 25 MCG (1000 UT) tablet, Take 2,000 Units by mouth daily., Disp: , Rfl:  .  diclofenac sodium (VOLTAREN) 1 % GEL, Apply 2 g topically 3 (three) times daily., Disp: 100 g, Rfl: 3 .  fluticasone (FLONASE) 50 MCG/ACT nasal spray, Place 2 sprays into both nostrils daily. (Patient taking differently: Place 2 sprays into both nostrils daily. ), Disp: 16 g, Rfl: 6 .  Ginkgo Biloba (GINKOBA) 40 MG TABS, Take 120 mg by mouth daily., Disp: , Rfl:  .  Multiple Vitamins-Minerals (MULTIVITAMIN ADULT) TABS, Take by mouth daily., Disp: , Rfl:  .  pravastatin (PRAVACHOL) 40 MG tablet, TAKE 1 TABLET BY MOUTH EVERY DAY, Disp: 90 tablet, Rfl: 3 .  TURMERIC PO, Take 500 mg by mouth daily. , Disp: , Rfl:   Patient Care Team: Birdie Sons, MD as PCP - General (Family Medicine) [place], [place] as Consulting Physician (Optometry) Dew, Erskine Squibb, MD as Referring Physician (Vascular Surgery)    Objective:    Vitals: BP 120/60 (BP Location: Right Arm, Patient Position: Sitting, Cuff Size: Normal)   Pulse (!) 59   Temp 98 F (36.7 C) (Oral)   Resp 16   Wt 109 lb (49.4 kg)   SpO2 97% Comment: room air  BMI 20.60 kg/m     Activities of Daily Living In your present  state of health, do you have any difficulty performing the following activities: 09/29/2018  Hearing? N  Vision? N  Difficulty concentrating or making decisions? N  Walking or climbing stairs? N  Dressing or bathing? N  Doing errands, shopping? N  Some recent data might be hidden    Fall Risk Assessment Fall Risk  09/29/2018 09/27/2017 08/26/2016 12/12/2014  Falls in the past year? 0 No No No  Number falls in past yr: 0 - - -  Injury with Fall? 0 - - -  Follow up Falls evaluation completed - - -     Depression Screen PHQ 2/9 Scores 09/29/2018 09/27/2017 08/26/2016 08/26/2016  PHQ - 2 Score 0 0 1 1  PHQ- 9 Score - 1 3 -    Cognitive Testing - 6-CIT  Correct? Score   What year is it? yes 0 0 or 4  What month is it? yes 0 0 or 3  Memorize:    Pia Mau,  43,  [address]  [place],      What time is it? (within 1 hour) yes 0 0 or 3  Count backwards from 20 yes 0 0, 2, or 4  Name the months of the year yes 0 0, 2, or 4  Repeat name & address above yes 0 0, 2, 4, 6, 8, or 10       TOTAL SCORE  0/28   Interpretation:  Normal  Normal (0-7) Abnormal (8-28)      Assessment & Plan:     Annual Wellness Visit  Reviewed patient's Family Medical History Reviewed and updated list of patient's medical providers Assessment of cognitive impairment was done Assessed patient's functional ability Established a written schedule for health screening [place] Completed and Reviewed  Exercise Activities and Dietary recommendations Goals    . DIET - REDUCE SUGAR INTAKE     Recommend to monitor daily sugar intake and substitute for fruits.     . Reduce alcohol intake to 0-1 servings per day     Recommend decreasing wine intake to 0-1 glasses a day.        Immunization History  Administered Date(s) Administered  . Influenza, High Dose Seasonal PF 11/23/2014, 12/05/2015, 11/18/2016, 11/26/2017  . Pneumococcal Conjugate-13 11/23/2014  . Pneumococcal Polysaccharide-23  12/05/2015  . Tdap 09/06/2007, 12/24/2011  . Zoster 06/20/2012    Health Maintenance  Topic Date Due  . INFLUENZA VACCINE  10/07/2018  . MAMMOGRAM  11/03/2018  . COLONOSCOPY  10/28/2020  . DEXA SCAN  11/02/2020  . TETANUS/TDAP  12/23/2021  . Hepatitis C Screening  Completed  . PNA vac Low Risk Adult  Completed     Discussed health benefits of physical activity, and encouraged her to engage in regular exercise appropriate for her age and condition.    ------------------------------------------------------------------------------------------------------------    Lelon Huh, MD  [place]

## 2018-09-30 LAB — COMPREHENSIVE METABOLIC PANEL
ALT: 21 IU/L (ref 0–32)
AST: 22 IU/L (ref 0–40)
Albumin/Globulin Ratio: 2.2 (ref 1.2–2.2)
Albumin: 4.6 g/dL (ref 3.7–4.7)
Alkaline Phosphatase: 53 IU/L (ref 39–117)
BUN/Creatinine Ratio: 17 (ref 12–28)
BUN: 12 mg/dL (ref 8–27)
Bilirubin Total: 0.4 mg/dL (ref 0.0–1.2)
CO2: 24 mmol/L (ref 20–29)
Calcium: 9.1 mg/dL (ref 8.7–10.3)
Chloride: 101 mmol/L (ref 96–106)
Creatinine, Ser: 0.72 mg/dL (ref 0.57–1.00)
GFR calc Af Amer: 97 mL/min/{1.73_m2} (ref >59)
GFR calc non Af Amer: 84 mL/min/{1.73_m2} (ref >59)
Globulin, Total: 2.1 g/dL (ref 1.5–4.5)
Glucose: 76 mg/dL (ref 65–99)
Potassium: 4.3 mmol/L (ref 3.5–5.2)
Sodium: 141 mmol/L (ref 134–144)
Total Protein: 6.7 g/dL (ref 6.0–8.5)

## 2018-09-30 LAB — LIPID PANEL
Chol/HDL Ratio: 2.3 ratio (ref 0.0–4.4)
Cholesterol, Total: 206 mg/dL — ABNORMAL HIGH (ref 100–199)
HDL: 89 mg/dL (ref >39)
LDL Calculated: 105 mg/dL — ABNORMAL HIGH (ref 0–99)
Triglycerides: 62 mg/dL (ref 0–149)
VLDL Cholesterol Cal: 12 mg/dL (ref 5–40)

## 2018-09-30 LAB — VITAMIN D 25 HYDROXY (VIT D DEFICIENCY, FRACTURES): Vit D, 25-Hydroxy: 79.1 ng/mL (ref 30.0–100.0)

## 2018-09-30 LAB — CK: Total CK: 128 U/L (ref 32–182)

## 2018-10-02 ENCOUNTER — Telehealth: Payer: Self-pay | Admitting: Family Medicine

## 2018-10-02 NOTE — Telephone Encounter (Signed)
Pt called about her lab results from Friday  CB#  [phone]  teri

## 2018-10-17 ENCOUNTER — Telehealth: Payer: Self-pay

## 2018-10-17 NOTE — Telephone Encounter (Signed)
Pt and husband are moving to [place] and will no longer be a pt here. FYI!

## 2020-07-11 IMAGING — MG MM DIGITAL SCREENING BILAT W/ TOMO W/ CAD
8 series · 9 of 24 positions shown · non-contrast
Comparison: Previous exam(s).

CLINICAL DATA: Screening.

EXAM:
DIGITAL SCREENING BILATERAL MAMMOGRAM WITH TOMO AND CAD

[L CC synth-2D]
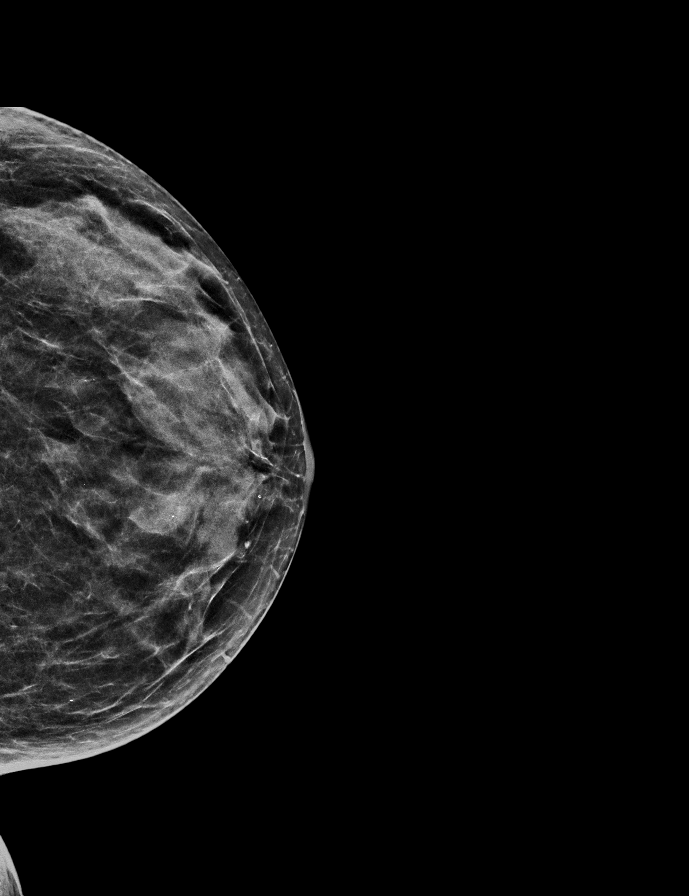

[L MLO synth-2D]
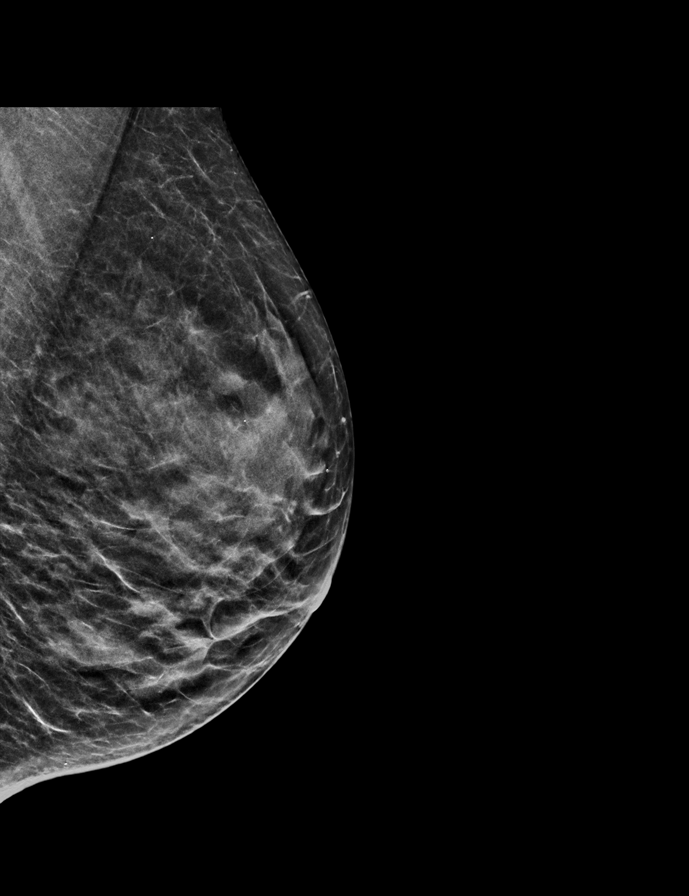

[R CC synth-2D]
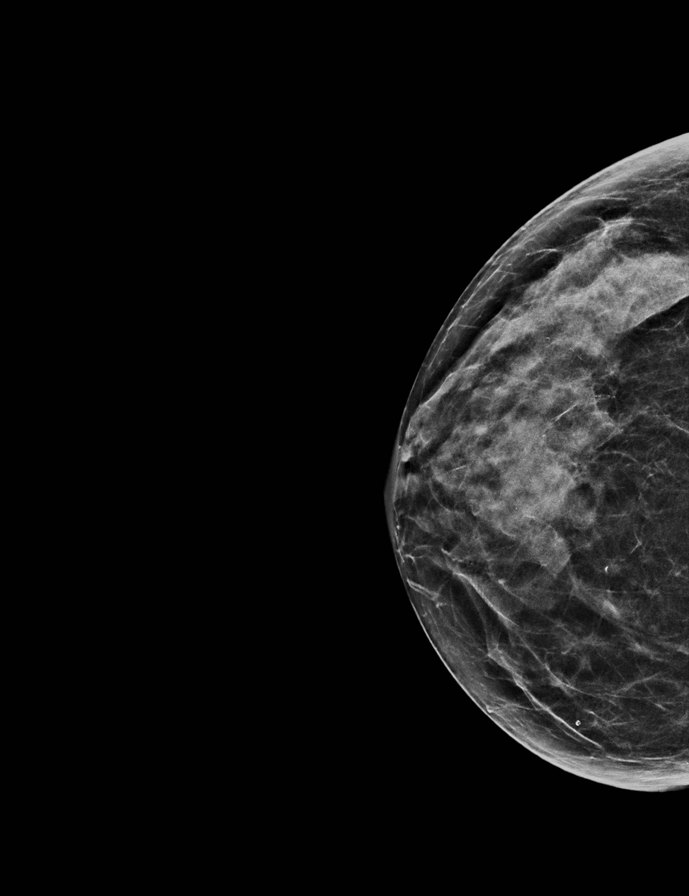

[R MLO synth-2D]
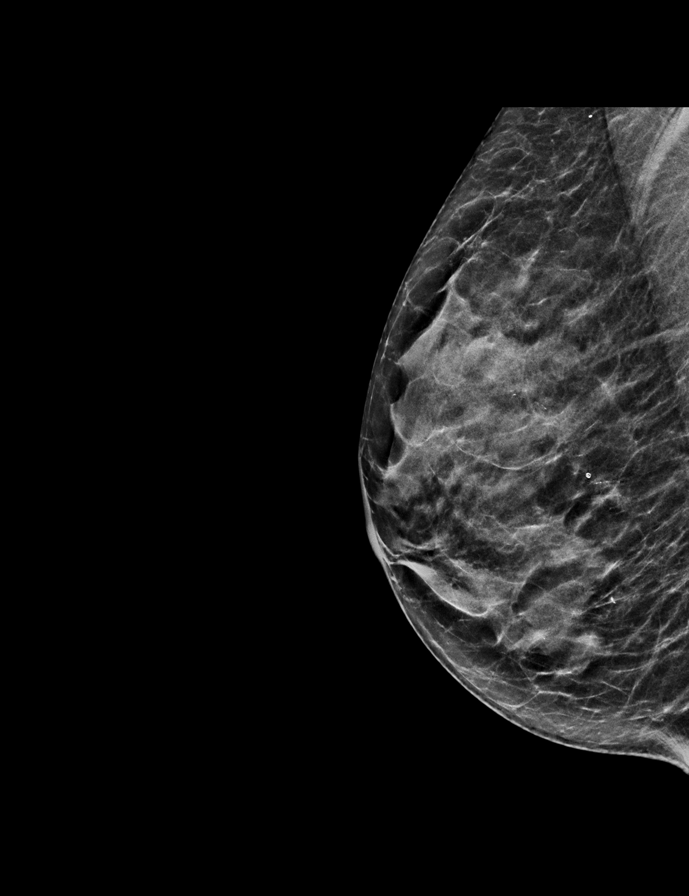

[L MLO tomo · 2 of 47 frames shown]
[frame 16/47]
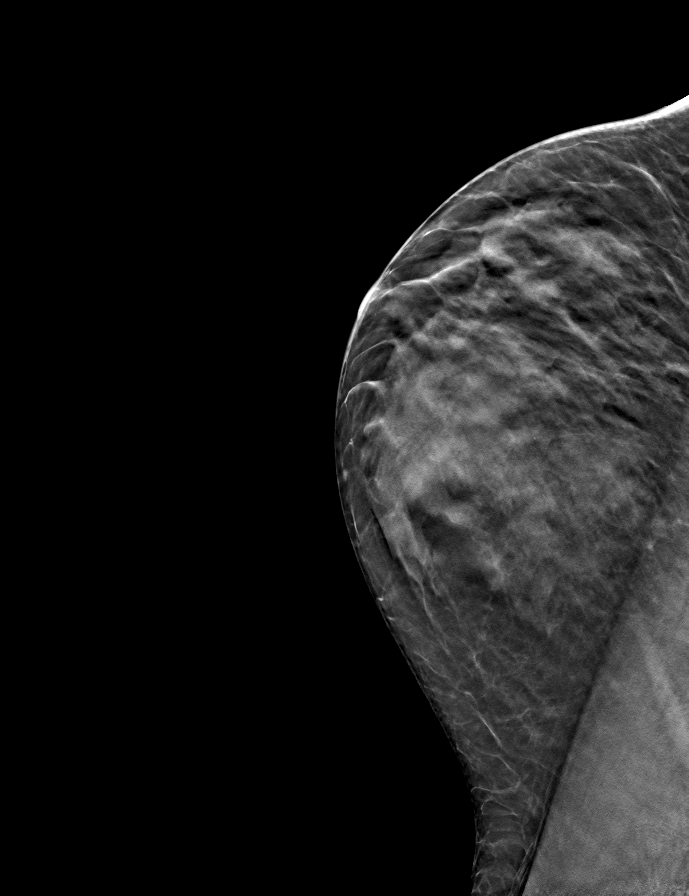
[frame 24/47]
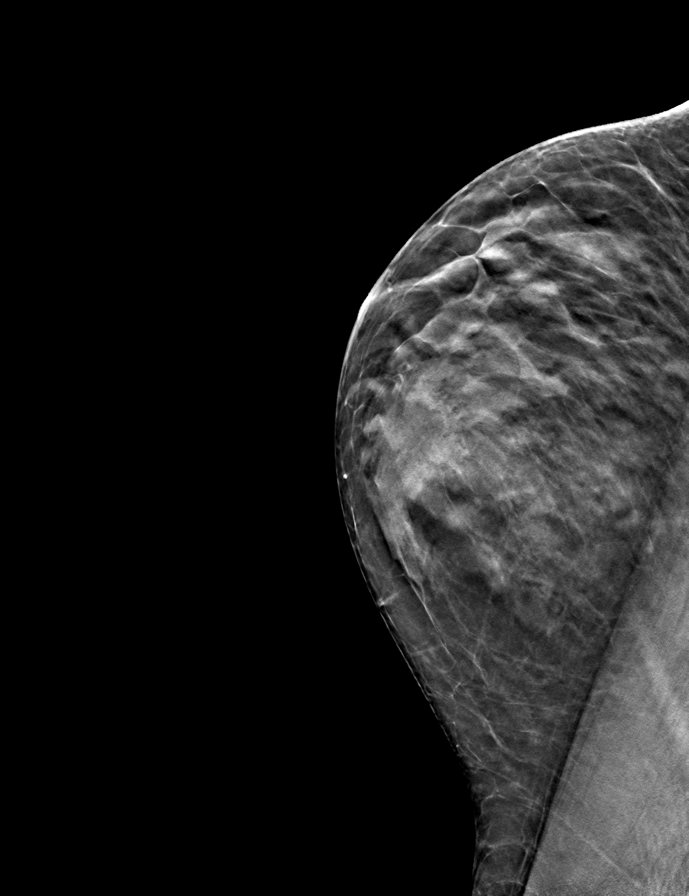

[R MLO tomo · tomo slice 25/48.0]
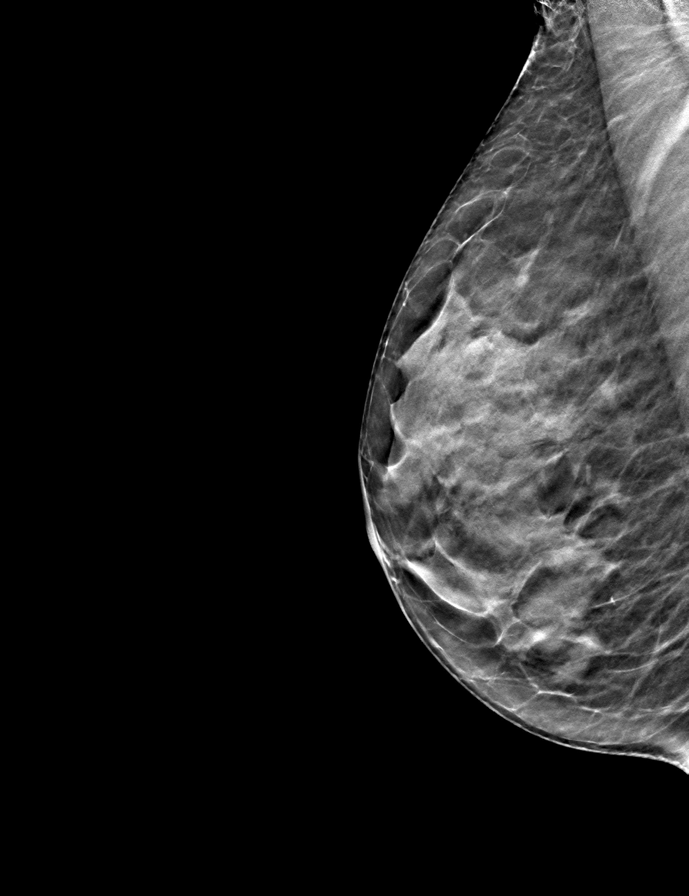

[L CC tomo · tomo slice 25/48.0]
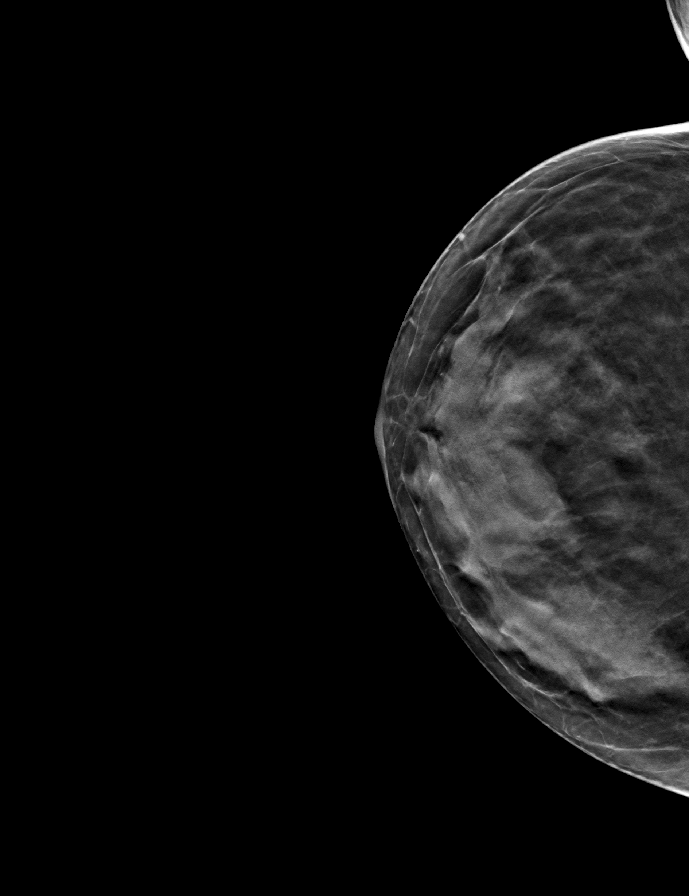

[R CC tomo · tomo slice 25/49.0]
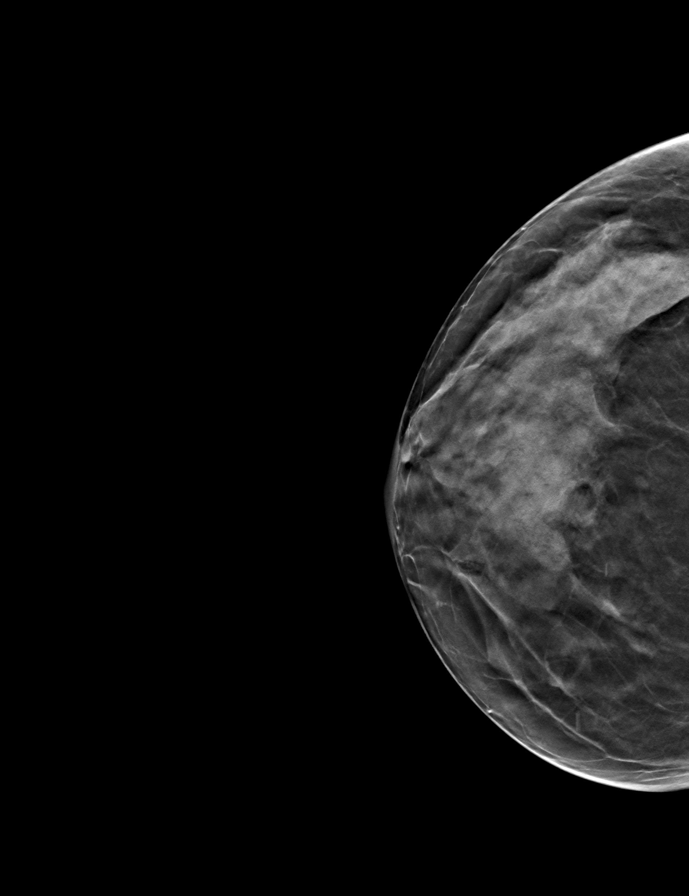

[9 of 24 positions shown; findings below may reference images not displayed]

ACR Breast Density Category d: The breast tissue is extremely dense,
which lowers the sensitivity of mammography.
FINDINGS: There are no findings suspicious for malignancy. Images were
processed with CAD.
IMPRESSION: No mammographic evidence of malignancy. A result letter of this
screening mammogram will be mailed directly to the patient.

RECOMMENDATION:
Screening mammogram in one year. (Code:RA-I-AVB)

BI-RADS CATEGORY  1: Negative.

## 2021-02-24 ENCOUNTER — Telehealth: Payer: Self-pay | Admitting: Family Medicine

## 2021-02-24 DIAGNOSIS — Z86010 Personal history of colonic polyps: Secondary | ICD-10-CM

## 2021-02-24 NOTE — Telephone Encounter (Signed)
Is now due, have place referral order.

## 2021-02-24 NOTE — Telephone Encounter (Signed)
Patient advised. She says she has moved out of town. She doesn't needs a referral from our office. She just wanted to know when she needed her next colonoscopy. She plans to have her new PCP order and set up the referral.

## 2021-02-24 NOTE — Telephone Encounter (Signed)
Patient called in, ha had poyp in the past, had colonscopy inv2019 and she wants to know shwould she have another one done now or how long should she wait?

## 2021-02-24 NOTE — Telephone Encounter (Signed)
Please advise colonoscopy? Last one done on 10/28/2017>>advised 3 year follow up.
